# Patient Record
Sex: Male | Born: 1990 | Race: Black or African American | Hispanic: No | Marital: Single | Smoking: Never smoker
Health system: Southern US, Community
[De-identification: ages and names within clinical notes are randomized; demographics above are authoritative.]

## PROBLEM LIST (undated history)

## (undated) DIAGNOSIS — E119 Type 2 diabetes mellitus without complications: Secondary | ICD-10-CM

## (undated) DIAGNOSIS — E669 Obesity, unspecified: Secondary | ICD-10-CM

---

## 2011-02-01 ENCOUNTER — Emergency Department (HOSPITAL_COMMUNITY)
Admission: EM | Admit: 2011-02-01 | Discharge: 2011-02-01 | Disposition: A | Discharge status: Home / Self Care | Payer: Managed Care, Other (non HMO) | Attending: Emergency Medicine | Admitting: Emergency Medicine

## 2011-02-01 DIAGNOSIS — S6000XA Contusion of unspecified finger without damage to nail, initial encounter: Secondary | ICD-10-CM | POA: Insufficient documentation

## 2011-02-01 DIAGNOSIS — M79609 Pain in unspecified limb: Secondary | ICD-10-CM | POA: Insufficient documentation

## 2011-02-01 DIAGNOSIS — S61209A Unspecified open wound of unspecified finger without damage to nail, initial encounter: Secondary | ICD-10-CM | POA: Insufficient documentation

## 2011-02-01 DIAGNOSIS — W268XXA Contact with other sharp object(s), not elsewhere classified, initial encounter: Secondary | ICD-10-CM | POA: Insufficient documentation

## 2013-02-21 ENCOUNTER — Emergency Department (HOSPITAL_COMMUNITY)
Admission: EM | Admit: 2013-02-21 | Discharge: 2013-02-22 | Disposition: A | Discharge status: Home / Self Care | Payer: Self-pay | Attending: Emergency Medicine | Admitting: Emergency Medicine

## 2013-02-21 ENCOUNTER — Encounter (HOSPITAL_COMMUNITY): Payer: Self-pay | Admitting: Emergency Medicine

## 2013-02-21 DIAGNOSIS — J3489 Other specified disorders of nose and nasal sinuses: Secondary | ICD-10-CM | POA: Insufficient documentation

## 2013-02-21 DIAGNOSIS — R059 Cough, unspecified: Secondary | ICD-10-CM | POA: Insufficient documentation

## 2013-02-21 DIAGNOSIS — J020 Streptococcal pharyngitis: Secondary | ICD-10-CM | POA: Insufficient documentation

## 2013-02-21 DIAGNOSIS — R05 Cough: Secondary | ICD-10-CM | POA: Insufficient documentation

## 2013-02-21 LAB — RAPID STREP SCREEN (MED CTR MEBANE ONLY): Streptococcus, Group A Screen (Direct): POSITIVE — AB

## 2013-02-21 NOTE — ED Notes (Signed)
PT. REPORTS SORE THROAT " HARD TO SWALLOW" FOR 2 DAYS WITH OCCASIONAL DRY COUGH , DENIES FEVER OR CHILLS.

## 2013-02-22 MED ORDER — PENICILLIN G BENZATHINE 1200000 UNIT/2ML IM SUSP
1.2000 10*6.[IU] | Freq: Once | INTRAMUSCULAR | Status: AC
  Administered 2013-02-22: 1.2 10*6.[IU] via INTRAMUSCULAR
  Filled 2013-02-22: qty 2

## 2013-02-22 NOTE — ED Notes (Signed)
Pt made aware that he has to wait 15-20 minutes after antibiotic to ensure he doesn't have an allergic reaction.

## 2013-02-22 NOTE — ED Provider Notes (Signed)
Medical screening examination/treatment/procedure(s) were performed by non-physician practitioner and as supervising physician I was immediately available for consultation/collaboration.  Karianna Gusman K Nolene Rocks-Rasch, MD 02/22/13 0134 

## 2013-02-22 NOTE — ED Provider Notes (Signed)
History     CSN: [id number]  Arrival date & time 02/21/13  2202   None     Chief Complaint  Patient presents with  . Sore Throat    (Consider location/radiation/quality/duration/timing/severity/associated sxs/prior treatment) HPI History provided by pt.   Pt presents w/ 2 days of sore throat.  Associated w/ rhinorrhea and cough.  Denies fever.  Has not taken anything for pain.  No known sick contacts.  History reviewed. No pertinent past medical history.  History reviewed. No pertinent past surgical history.  No family history on file.  History  Substance Use Topics  . Smoking status: Never Smoker   . Smokeless tobacco: Not on file  . Alcohol Use: Yes      Review of Systems  All other systems reviewed and are negative.    Allergies  Review of patient's allergies indicates no known allergies.  Home Medications  No current outpatient prescriptions on file.  BP 133/76  Pulse 65  Temp(Src) 98 F (36.7 C) (Oral)  Resp 14  SpO2 96%  Physical Exam  Nursing note and vitals reviewed. Constitutional: He is oriented to person, place, and time. He appears well-developed and well-nourished. No distress.  HENT:  Head: Normocephalic and atraumatic.  Erythema soft palate, tonsils and posterior pharynx.  Mild edema bilateral tonsils w/out exudate.  Uvula midline.  No trismus.   Eyes:  Normal appearance  Neck: Normal range of motion.  Cardiovascular: Normal rate and regular rhythm.   Pulmonary/Chest: Effort normal and breath sounds normal. No respiratory distress.  Musculoskeletal: Normal range of motion.  Lymphadenopathy:    He has cervical adenopathy.  Neurological: He is alert and oriented to person, place, and time.  Skin: Skin is warm and dry. No rash noted.  Psychiatric: He has a normal mood and affect. His behavior is normal.    ED Course  Procedures (including critical care time)  Labs Reviewed  RAPID STREP SCREEN - Abnormal; Notable for the following:     Streptococcus, Group A Screen (Direct) POSITIVE (*)    All other components within normal limits   No results found.   1. Strep pharyngitis       MDM  22yo healthy M presents w/ strep pharyngitis.  Received IM bicillin.  Recommended ibuprofen for pain.  Return precautions discussed.          Otilio Miu, PA-C 02/22/13 [phone]

## 2013-02-22 NOTE — ED Notes (Signed)
No s/s allergic reaction. Pt dx home.

## 2013-04-30 ENCOUNTER — Emergency Department (HOSPITAL_COMMUNITY)
Admission: EM | Admit: 2013-04-30 | Discharge: 2013-04-30 | Disposition: A | Discharge status: Home / Self Care | Payer: Self-pay | Attending: Emergency Medicine | Admitting: Emergency Medicine

## 2013-04-30 ENCOUNTER — Encounter (HOSPITAL_COMMUNITY): Payer: Self-pay | Admitting: Emergency Medicine

## 2013-04-30 DIAGNOSIS — R509 Fever, unspecified: Secondary | ICD-10-CM | POA: Insufficient documentation

## 2013-04-30 DIAGNOSIS — J020 Streptococcal pharyngitis: Secondary | ICD-10-CM | POA: Insufficient documentation

## 2013-04-30 DIAGNOSIS — H9209 Otalgia, unspecified ear: Secondary | ICD-10-CM | POA: Insufficient documentation

## 2013-04-30 MED ORDER — DEXAMETHASONE SODIUM PHOSPHATE 10 MG/ML IJ SOLN
10.0000 mg | Freq: Once | INTRAMUSCULAR | Status: AC
  Administered 2013-04-30: 10 mg via INTRAMUSCULAR
  Filled 2013-04-30: qty 1

## 2013-04-30 MED ORDER — PENICILLIN G BENZATHINE 1200000 UNIT/2ML IM SUSP
1.2000 10*6.[IU] | Freq: Once | INTRAMUSCULAR | Status: AC
  Administered 2013-04-30: 1.2 10*6.[IU] via INTRAMUSCULAR
  Filled 2013-04-30: qty 2

## 2013-04-30 NOTE — ED Provider Notes (Signed)
Medical screening examination/treatment/procedure(s) were performed by non-physician practitioner and as supervising physician I was immediately available for consultation/collaboration.   Gwyneth Sprout, MD 04/30/13 2154

## 2013-04-30 NOTE — ED Notes (Signed)
Started 4 days ago with sore throat, fever, chills, right ear pain. States his son has had strep.

## 2013-04-30 NOTE — ED Provider Notes (Signed)
  CSN: [id number]     Arrival date & time 04/30/13  0831 History     First MD Initiated Contact with Patient 04/30/13 0845     Chief Complaint  Patient presents with  . Sore Throat   (Consider location/radiation/quality/duration/timing/severity/associated sxs/prior Treatment) The history is provided by the patient and medical records.   Patient presents to the ED for sore throat x4 days associated symptoms include subjective fever, chills, and right ear pain. Patient states pain is worse with swallowing. Son recently diagnosed and treated for strep. No difficulty swallowing or breathing.  No meds taken PTA.  History reviewed. No pertinent past medical history. History reviewed. No pertinent past surgical history. No family history on file. History  Substance Use Topics  . Smoking status: Never Smoker   . Smokeless tobacco: Not on file  . Alcohol Use: Yes    Review of Systems  HENT: Positive for sore throat.   All other systems reviewed and are negative.    Allergies  Review of patient's allergies indicates no known allergies.  Home Medications  No current outpatient prescriptions on file. BP 136/77  Pulse 88  Temp(Src) 98.8 F (37.1 C) (Oral)  Resp 22  SpO2 98%  Physical Exam  Nursing note and vitals reviewed. Constitutional: He is oriented to person, place, and time. He appears well-developed and well-nourished.  HENT:  Head: Normocephalic and atraumatic. No trismus in the jaw.  Right Ear: Tympanic membrane and ear canal normal.  Left Ear: Tympanic membrane and ear canal normal.  Nose: Nose normal.  Mouth/Throat: Uvula is midline and mucous membranes are normal. No oral lesions. No edematous. Posterior oropharyngeal erythema present. No oropharyngeal exudate, posterior oropharyngeal edema or tonsillar abscesses.  Oropharynx erythematous, tonsils 2+ bilaterally without exudate, no oropharyngeal edema, airway patent, no PTA, handling secretions appropriately, speaking  in full complete sentences without difficulty  Eyes: Conjunctivae and EOM are normal. Pupils are equal, round, and reactive to light.  Neck: Normal range of motion. Neck supple.  Cardiovascular: Normal rate, regular rhythm and normal heart sounds.   Pulmonary/Chest: Effort normal and breath sounds normal.  Musculoskeletal: Normal range of motion.  Neurological: He is alert and oriented to person, place, and time.  Skin: Skin is warm and dry.  Psychiatric: He has a normal mood and affect.    ED Course   Procedures (including critical care time)  Labs Reviewed  RAPID STREP SCREEN - Abnormal; Notable for the following:    Streptococcus, Group A Screen (Direct) POSITIVE (*)    All other components within normal limits   No results found.  1. Strep pharyngitis     MDM   Rapid strep positive. Treated with Bicillin and Decadron in the ED. Followup with primary care physician if symptoms not improving in the next few days. Discussed plan with patient, he agreed. Return precautions advised.  Garlon Hatchet, PA-C 04/30/13 1555

## 2013-09-24 ENCOUNTER — Emergency Department (HOSPITAL_COMMUNITY)
Admission: EM | Admit: 2013-09-24 | Discharge: 2013-09-24 | Disposition: A | Discharge status: Home / Self Care | Payer: Managed Care, Other (non HMO) | Attending: Emergency Medicine | Admitting: Emergency Medicine

## 2013-09-24 ENCOUNTER — Encounter (HOSPITAL_COMMUNITY): Payer: Self-pay | Admitting: Emergency Medicine

## 2013-09-24 DIAGNOSIS — L0291 Cutaneous abscess, unspecified: Secondary | ICD-10-CM

## 2013-09-24 DIAGNOSIS — L0231 Cutaneous abscess of buttock: Secondary | ICD-10-CM | POA: Insufficient documentation

## 2013-09-24 DIAGNOSIS — E669 Obesity, unspecified: Secondary | ICD-10-CM | POA: Insufficient documentation

## 2013-09-24 HISTORY — DX: Obesity, unspecified: E66.9

## 2013-09-24 MED ORDER — SULFAMETHOXAZOLE-TRIMETHOPRIM 800-160 MG PO TABS: 2.0000 | ORAL_TABLET | Freq: Two times a day (BID) | ORAL | Status: DC

## 2013-09-24 NOTE — ED Notes (Signed)
Pt. reports  ? insect bite / abscess at right buttocks onset 2 days ago with no drainage.

## 2013-09-24 NOTE — ED Provider Notes (Signed)
CSN: [id number]     Arrival date & time 09/24/13  2009 History  This chart was scribed for non-physician practitioner, Santiago Glad, PA-C working with Glynn Octave, MD by Greggory Stallion, ED scribe. This patient was seen in room TR10C/TR10C and the patient's care was started at 9:17 PM.   Chief Complaint  Patient presents with  . Abscess  . Insect Bite   The history is provided by the patient. No language interpreter was used.   HPI Comments: Patrick Montes is a 22 y.o. male who presents to the Emergency Department complaining of a worsening abscess to his right buttock that he noticed 2 days ago. Denies any drainage. Denies fever, chills, nausea, emesis. Pt states he has never had an abscess before. Denies history of diabetes or HIV.  No treatment prior to arrival.  Past Medical History  Diagnosis Date  . Obesity    History reviewed. No pertinent past surgical history. No family history on file. History  Substance Use Topics  . Smoking status: Never Smoker   . Smokeless tobacco: Not on file  . Alcohol Use: Yes    Review of Systems  Constitutional: Negative for fever and chills.  Gastrointestinal: Negative for nausea and vomiting.  Skin: Positive for color change.       Positive for abscess.   All other systems reviewed and are negative.    Allergies  Review of patient's allergies indicates no known allergies.  Home Medications  No current outpatient prescriptions on file.  BP 161/83  Pulse 108  Temp(Src) 98.1 F (36.7 C) (Oral)  Resp 18  Wt 350 lb (158.759 kg)  SpO2 99%  Physical Exam  Nursing note and vitals reviewed. Constitutional: He appears well-developed and well-nourished.  HENT:  Head: Normocephalic and atraumatic.  Mouth/Throat: Oropharynx is clear and moist.  Eyes: EOM are normal. Pupils are equal, round, and reactive to light.  Neck: Normal range of motion. Neck supple.  Cardiovascular: Normal rate, regular rhythm and normal heart sounds.    Pulmonary/Chest: Effort normal and breath sounds normal. He has no wheezes.  Musculoskeletal: Normal range of motion.  Neurological: He is alert.  Skin: Skin is warm and dry.  3-4 cm in diameter indurated area of the right buttock with mild surrounding erythema and warmth.   Psychiatric: He has a normal mood and affect. His behavior is normal.    ED Course  Procedures (including critical care time)  DIAGNOSTIC STUDIES: Oxygen Saturation is 99% on RA, normal by my interpretation.    COORDINATION OF CARE: 9:19 PM-Discussed treatment plan which includes I&D with pt at bedside and pt agreed to plan.   INCISION AND DRAINAGE Performed by: Santiago Glad, PA-C Consent: Verbal consent obtained. Risks and benefits: risks, benefits and alternatives were discussed Type: abscess  Body area: right buttock  Anesthesia: local infiltration  Incision was made with a scalpel.  Local anesthetic: lidocaine 2% with epinephrine  Anesthetic total: 4 ml  Complexity: complex Blunt dissection to break up loculations  Drainage: purulent  Drainage amount: small  Packing material: none  Patient tolerance: Patient tolerated the procedure well with no immediate complications.   Labs Review Labs Reviewed - No data to display Imaging Review No results found.  EKG Interpretation   None       MDM  No diagnosis found. Patient presenting with an abscess of his right buttock.  Area incised and drained in the ED with little drainage.  Patient started on Bactrim DS.  Patient is afebrile and  non toxic appearing.  Feel that the patient is stable for discharge.  Return precautions given.  I personally performed the services described in this documentation, which was scribed in my presence. The recorded information has been reviewed and is accurate.   Santiago Glad, PA-C 09/24/13 2229

## 2013-09-25 NOTE — ED Provider Notes (Signed)
Medical screening examination/treatment/procedure(s) were performed by non-physician practitioner and as supervising physician I was immediately available for consultation/collaboration.  EKG Interpretation   None         Skyah Hannon, MD 09/25/13 0035 

## 2014-03-16 ENCOUNTER — Emergency Department (HOSPITAL_COMMUNITY): Payer: Managed Care, Other (non HMO)

## 2014-03-16 ENCOUNTER — Emergency Department (HOSPITAL_COMMUNITY)
Admission: EM | Admit: 2014-03-16 | Discharge: 2014-03-16 | Disposition: A | Discharge status: Home / Self Care | Payer: Managed Care, Other (non HMO) | Attending: Emergency Medicine | Admitting: Emergency Medicine

## 2014-03-16 ENCOUNTER — Encounter (HOSPITAL_COMMUNITY): Payer: Self-pay | Admitting: Emergency Medicine

## 2014-03-16 DIAGNOSIS — S0990XA Unspecified injury of head, initial encounter: Secondary | ICD-10-CM | POA: Insufficient documentation

## 2014-03-16 DIAGNOSIS — S5010XA Contusion of unspecified forearm, initial encounter: Secondary | ICD-10-CM

## 2014-03-16 DIAGNOSIS — E669 Obesity, unspecified: Secondary | ICD-10-CM | POA: Insufficient documentation

## 2014-03-16 DIAGNOSIS — S0101XA Laceration without foreign body of scalp, initial encounter: Secondary | ICD-10-CM

## 2014-03-16 DIAGNOSIS — S0100XA Unspecified open wound of scalp, initial encounter: Secondary | ICD-10-CM | POA: Insufficient documentation
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
## 2014-03-16 NOTE — Discharge Instructions (Signed)
Contusion A contusion is a deep bruise. Contusions are the result of an injury that caused bleeding under the skin. The contusion may turn blue, purple, or yellow. Minor injuries will give you a painless contusion, but more severe contusions may stay painful and swollen for a few weeks.  CAUSES  A contusion is usually caused by a blow, trauma, or direct force to an area of the body. SYMPTOMS   Swelling and redness of the injured area.  Bruising of the injured area.  Tenderness and soreness of the injured area.  Pain. DIAGNOSIS  The diagnosis can be made by taking a history and physical exam. An X-ray, CT scan, or MRI may be needed to determine if there were any associated injuries, such as fractures. TREATMENT  Specific treatment will depend on what area of the body was injured. In general, the best treatment for a contusion is resting, icing, elevating, and applying cold compresses to the injured area. Over-the-counter medicines may also be recommended for pain control. Ask your caregiver what the best treatment is for your contusion. HOME CARE INSTRUCTIONS   Put ice on the injured area.  Put ice in a plastic bag.  Place a towel between your skin and the bag.  Leave the ice on for 15-20 minutes, 03-04 times a day.  Only take over-the-counter or prescription medicines for pain, discomfort, or fever as directed by your caregiver. Your caregiver may recommend avoiding anti-inflammatory medicines (aspirin, ibuprofen, and naproxen) for 48 hours because these medicines may increase bruising.  Rest the injured area.  If possible, elevate the injured area to reduce swelling. SEEK IMMEDIATE MEDICAL CARE IF:   You have increased bruising or swelling.  You have pain that is getting worse.  Your swelling or pain is not relieved with medicines. MAKE SURE YOU:   Understand these instructions.  Will watch your condition.  Will get help right away if you are not doing well or get  worse. Document Released: 06/30/2005 Document Revised: 12/13/2011 Document Reviewed: 07/26/2011 [facility]ExitCare Patient Information 2014 [place] HarborExitCare, [place].  Head Injury The head may be injured, even if there are no obvious signs of injury. Obvious signs of injury include, loss of consciousness (being "knocked out"), or physical signs, such as an open wound (the skin is broken) or bruising (ecchymosis). SYMPTOMS   Symptoms depend on the extent of injury.  The seriousness of a head injury is not related to the presence of physical signs, such as swelling.  Headache.  Nausea and vomiting.  Drowsiness.  Memory loss (amnesia).  Vision problems.  Confusion or irritability.  Pupils of different size.  Pupils that do not react to light.  Loss of consciousness, either temporary or for long periods.  Bleeding of the scalp, if the skin is broken. CAUSES  The most common cause of head injury is direct hit (trauma) to the head. Common causes of this injury include: motor vehicle crashes, falls, or tackling with the head (football). RISK INCREASES WITH:   Contact sports (i.e. football, boxing), riding bicycles, motorcycles, or horses without a helmet.  Seizure disorders.  Drinking alcohol.  Use of mind-altering drugs. PREVENTION   Wear properly fitted and padded protective headgear.  Do not drink alcohol or use mind-altering drugs and drive. PROGNOSIS  If treated early, most head injuries can be cured. However, certain problems involved with head injuries can be life threatening. RELATED COMPLICATIONS   Subdural hemorrhage or epidural hematoma (bleeding under the skull).  Concussion (injury to the brain).  Bleeding  into the brain. TREATMENT  Any injury to the head should be evaluated by a medical professional, especially if the injury involves loss of consciousness or other symptoms noted above. Severe head injuries may require hospitalization and possible surgery, to relieve  pressure on the brain. After evaluation, if you are sent to be watched at home, it is important to have someone else wake you up every 2 hours, for at least 24 hours following the injury. If the person is not able to wake you, or if there appears to be a change in your responsiveness, he or she must contact your caregiver immediately. This may be a sign of a more serious injury. Also, report any of the following symptoms: nausea, vomiting, inability to move arms and legs equally well on both sides, fever (above 100 F or 37.8 C), neck stiffness, pupils of unequal size or shape or reaction to light, convulsions, noticeable restlessness, severe headache that persists for longer than 4 hours after injury, confusion, disorientation, or mental status changes.  MEDICATION  Do not take any medicines, unless advised by your caregiver. This includes over-the-counter pain medicines (i.e. ibuprofen, acetaminophen, aspirin). SEEK MEDICAL CARE IF:   Symptoms get worse or do not improve in 24 hours.  Any of the following symptoms occur:  Vomiting.  Inability to move arms and legs equally well on both sides.  Fever above 100 F (37.8 C).  Stiff neck.  Pupils of unequal size, shape, or reaction to light.  Convulsions (violent shaking).  Noticeable restlessness.  Severe headache that persists for longer than 4 hours after injury.  Confusion, disorientation or mental status changes. Document Released: 09/20/2005 Document Revised: 12/13/2011 Document Reviewed: 01/02/2009 [facility]ExitCare Patient Information 2014 [place], [place]LL[place].

## 2014-03-16 NOTE — ED Notes (Signed)
PATIENT TRANSPORTED TO CT AND XRAY

## 2014-12-13 ENCOUNTER — Emergency Department (HOSPITAL_COMMUNITY)
Admission: EM | Admit: 2014-12-13 | Discharge: 2014-12-13 | Disposition: A | Discharge status: Home / Self Care | Payer: Managed Care, Other (non HMO) | Attending: Emergency Medicine | Admitting: Emergency Medicine

## 2014-12-13 ENCOUNTER — Encounter (HOSPITAL_COMMUNITY): Payer: Self-pay | Admitting: Cardiology

## 2014-12-13 DIAGNOSIS — E669 Obesity, unspecified: Secondary | ICD-10-CM | POA: Diagnosis not present

## 2014-12-13 DIAGNOSIS — M25562 Pain in left knee: Secondary | ICD-10-CM | POA: Insufficient documentation

## 2014-12-13 DIAGNOSIS — Z79899 Other long term (current) drug therapy: Secondary | ICD-10-CM | POA: Insufficient documentation

## 2014-12-13 DIAGNOSIS — M25561 Pain in right knee: Secondary | ICD-10-CM | POA: Insufficient documentation

## 2014-12-13 DIAGNOSIS — M79605 Pain in left leg: Secondary | ICD-10-CM | POA: Diagnosis present

## 2014-12-13 DIAGNOSIS — R51 Headache: Secondary | ICD-10-CM | POA: Insufficient documentation

## 2014-12-13 MED ORDER — IBUPROFEN 600 MG PO TABS: 600.0000 mg | ORAL_TABLET | Freq: Four times a day (QID) | ORAL | Status: DC | PRN

## 2014-12-13 MED ORDER — IBUPROFEN 800 MG PO TABS
800.0000 mg | ORAL_TABLET | Freq: Once | ORAL | Status: AC
Start: 2014-12-13 — End: 2014-12-13
  Administered 2014-12-13: 800 mg via ORAL
  Filled 2014-12-13: qty 1

## 2014-12-13 NOTE — ED Notes (Signed)
Pt comfortable with discharge and follow up instructions. Pt declines wheelchair, escorted to waiting area by this RN. Prescriptions x1. 

## 2014-12-13 NOTE — Discharge Instructions (Signed)

## 2014-12-13 NOTE — ED Notes (Signed)
Pt reports bilateral knee pain and a headache for the past couple of day. Some abd pain but no n/v.

## 2014-12-13 NOTE — ED Provider Notes (Signed)
CSN: [id number]     Arrival date & time 12/13/14  1001 History   First MD Initiated Contact with Patient 12/13/14 1008     Chief Complaint  Patient presents with  . Leg Pain  . Headache     (Consider location/radiation/quality/duration/timing/severity/associated sxs/prior Treatment) HPI Patrick Montes is a 24 y.o. black male who has chronic knee pain and presents today for bilateral knee pain that is worse with squatting and using the stairs for the past couple of weeks. The pain does not radiate anywhere. He has recently lost 20 pounds by working out at [employer] He denies any injury.  He denies any fever, grinding, popping sensation. He has no pain now while sitting on the bed but it is 10/10 when he is working out at [employer]. He has not taken anything for knee pain in the past.  Past Medical History  Diagnosis Date  . Obesity    History reviewed. No pertinent past surgical history. History reviewed. No pertinent family history. History  Substance Use Topics  . Smoking status: Never Smoker   . Smokeless tobacco: Not on file  . Alcohol Use: Yes    Review of Systems  Constitutional: Negative for fever and chills.  HENT: Negative for ear pain.   Eyes: Negative for visual disturbance.  Gastrointestinal: Negative for nausea, vomiting and abdominal pain.  Musculoskeletal: Negative for back pain, neck pain and neck stiffness.  Neurological: Positive for headaches. Negative for dizziness, seizures, syncope and weakness.  All other systems reviewed and are negative.     Allergies  Review of patient's allergies indicates no known allergies.  Home Medications   Prior to Admission medications   Medication Sig Start Date End Date Taking? Authorizing Provider  acetaminophen (TYLENOL) 500 MG tablet Take 500 mg by mouth every 8 (eight) hours as needed for headache.   Yes Historical Provider, MD  ibuprofen (ADVIL,MOTRIN) 600 MG tablet Take 1 tablet (600 mg total) by mouth every 6 (six)  hours as needed. 12/13/14   Jemery Stacey Patel-Mills, PA-C   BP 165/90 mmHg  Pulse 102  Temp(Src) 98.6 F (37 C) (Oral)  Resp 20  Ht  (1.88 m)  Wt 335 lb (151.955 kg)  BMI 42.99 kg/m2  SpO2 96% Physical Exam  Constitutional: He is oriented to person, place, and time. He appears well-developed and well-nourished.  HENT:  Head: Normocephalic and atraumatic.  Mouth/Throat: Oropharynx is clear and moist.  Eyes: Conjunctivae are normal.  Neck: Normal range of motion. Neck supple.  Cardiovascular: Normal rate, regular rhythm and normal heart sounds.   Pulmonary/Chest: Effort normal and breath sounds normal.  Abdominal: Soft. There is no tenderness.  Musculoskeletal: Normal range of motion.  Negative anterior and posterior drawer. Negative valgus and varus. No patellar tenderness. He is able to ambulate in the ED.  No fibular head tenderness. He is able to flex knee to 90 degrees.   Neurological: He is alert and oriented to person, place, and time.  Skin: Skin is warm and dry.  Nursing note and vitals reviewed.   ED Course  Procedures (including critical care time) Labs Review Labs Reviewed - No data to display  Imaging Review No results found.   EKG Interpretation None      MDM   Final diagnoses:  Knee pain, bilateral  He says is headache is secondary to a hangover from drinking during hs birthday. No neurological signs.  No abdominal pain.  Patient is sitting comfortably and talking on the phone.  He requested bilateral knee xrays due to pain.  His knee exam was normal. [place] knee rule shows low risk. No injury to bilateral knees.  I explained to the patient that I did not think anything was acutely wrong with his knees and I have a low suspicion for a fracture.  No need to xray.  F/u with PCP and take ibuprofen as needed for pain. RICE. Patient agrees with plan.       Catha GosselinHanna Patel-Mills, PA-C 12/13/14 1049  Gilda Creasehristopher J Pollina, MD 12/13/14 1050

## 2015-01-05 ENCOUNTER — Emergency Department (HOSPITAL_COMMUNITY)
Admission: EM | Admit: 2015-01-05 | Discharge: 2015-01-05 | Disposition: A | Discharge status: Home / Self Care | Payer: Managed Care, Other (non HMO) | Attending: Emergency Medicine | Admitting: Emergency Medicine

## 2015-01-05 ENCOUNTER — Encounter (HOSPITAL_COMMUNITY): Payer: Self-pay | Admitting: Emergency Medicine

## 2015-01-05 DIAGNOSIS — M25562 Pain in left knee: Secondary | ICD-10-CM | POA: Insufficient documentation

## 2015-01-05 DIAGNOSIS — E669 Obesity, unspecified: Secondary | ICD-10-CM | POA: Insufficient documentation

## 2015-01-05 DIAGNOSIS — M25561 Pain in right knee: Secondary | ICD-10-CM | POA: Insufficient documentation

## 2015-01-05 MED ORDER — IBUPROFEN 600 MG PO TABS: 600.0000 mg | ORAL_TABLET | Freq: Four times a day (QID) | ORAL | Status: DC | PRN

## 2015-01-05 MED ORDER — TRAMADOL HCL 50 MG PO TABS: 50.0000 mg | ORAL_TABLET | Freq: Four times a day (QID) | ORAL | Status: DC | PRN

## 2015-01-05 NOTE — ED Notes (Signed)
Pt made aware to return if symptoms worsen or if any life threatening symptoms occur.   

## 2015-01-05 NOTE — ED Notes (Signed)
Pt c/o B/L knee pain x 4 days since playing basketball. Pt ambulatory in triage.

## 2015-01-05 NOTE — ED Provider Notes (Signed)
CSN: [id number]     Arrival date & time 01/05/15  1734 History  This chart was scribed for non-physician practitioner, Junius Finner, PA-C working with Jerelyn Scott, MD by Greggory Stallion, ED scribe. This patient was seen in room TR10C/TR10C and the patient's care was started at 6:29 PM.    Chief Complaint  Patient presents with  . Knee Pain   The history is provided by the patient. No language interpreter was used.    HPI Comments: Patrick Montes is a 24 y.o. male who presents to the Emergency Department complaining of bilateral knee pain that started 4 days ago after playing basketball. Pt denies fall or specific injury. Bearing weight worsens pain and causes it to be 10/10. Pain is minimal to non-existent while pt at rest and sitting. He has not yet taken any medications. There are no alleviating factors.   Past Medical History  Diagnosis Date  . Obesity    History reviewed. No pertinent past surgical history. No family history on file. History  Substance Use Topics  . Smoking status: Never Smoker   . Smokeless tobacco: Not on file  . Alcohol Use: Yes    Review of Systems  Musculoskeletal: Positive for arthralgias.  All other systems reviewed and are negative.  Allergies  Review of patient's allergies indicates no known allergies.  Home Medications   Prior to Admission medications   Medication Sig Start Date End Date Taking? Authorizing Provider  acetaminophen (TYLENOL) 500 MG tablet Take 500 mg by mouth every 8 (eight) hours as needed for headache.    Historical Provider, MD  ibuprofen (ADVIL,MOTRIN) 600 MG tablet Take 1 tablet (600 mg total) by mouth every 6 (six) hours as needed. 12/13/14   Hanna Patel-Mills, PA-C  ibuprofen (ADVIL,MOTRIN) 600 MG tablet Take 1 tablet (600 mg total) by mouth every 6 (six) hours as needed. 01/05/15   Junius Finner, PA-C  traMADol (ULTRAM) 50 MG tablet Take 1 tablet (50 mg total) by mouth every 6 (six) hours as needed. 01/05/15   Junius Finner, PA-C    BP 146/76 mmHg  Pulse 70  Temp(Src) 98.1 F (36.7 C) (Oral)  Resp 18  Ht  (1.88 m)  Wt 342 lb 8 oz (155.357 kg)  BMI 43.96 kg/m2  SpO2 96%   Physical Exam  Constitutional: He is oriented to person, place, and time. He appears well-developed and well-nourished.  HENT:  Head: Normocephalic and atraumatic.  Eyes: EOM are normal.  Neck: Normal range of motion.  Cardiovascular: Normal rate.   Pulmonary/Chest: Effort normal.  Musculoskeletal: Normal range of motion.  No deformity in bilateral knees. Full ROM. Tenderness over tibial tuberosity bilaterally. No edema, erythema, or warmth. No crepitus.   Neurological: He is alert and oriented to person, place, and time.  Skin: Skin is warm and dry.  Psychiatric: He has a normal mood and affect. His behavior is normal.  Nursing note and vitals reviewed.   ED Course  Procedures (including critical care time)  DIAGNOSTIC STUDIES: Oxygen Saturation is 96% on RA, normal by my interpretation.    COORDINATION OF CARE: 6:30 PM-Discussed treatment plan which includes an anti-inflammatory and rest with pt at bedside and pt agreed to plan. Will give pt an orthopedic referral and advised him to follow up.  Labs Review Labs Reviewed - No data to display  Imaging Review No results found.   EKG Interpretation None      MDM   Final diagnoses:  Bilateral knee pain  Pt is a 24yo male c/o bilateral knee pain after playing basketball. No hx of injury. No edema or erythema. Tenderness over tibial tuberosity. Likely an overuse injury. No imaging indicated at this time. Will tx with mobic and discussed use of R.I.C.E tx at home. F/u with PCP as needed as well as Dr. Roda ShuttersXu, orthopedics if not improving in 1-2 weeks. Pt verbalized understanding and agreement with tx plan.  I personally performed the services described in this documentation, which was scribed in my presence. The recorded information has been reviewed and is  accurate.   Junius Finnerrin O'Malley, PA-C 01/05/15 1853  Jerelyn ScottMartha Linker, MD 01/05/15 [phone]

## 2015-09-01 ENCOUNTER — Encounter: Payer: Self-pay | Admitting: Internal Medicine

## 2016-12-17 ENCOUNTER — Emergency Department (HOSPITAL_COMMUNITY)
Admission: EM | Admit: 2016-12-17 | Discharge: 2016-12-17 | Disposition: A | Discharge status: Home / Self Care | Payer: Commercial Managed Care - PPO | Attending: Emergency Medicine | Admitting: Emergency Medicine

## 2016-12-17 ENCOUNTER — Encounter (HOSPITAL_COMMUNITY): Payer: Self-pay

## 2016-12-17 DIAGNOSIS — J020 Streptococcal pharyngitis: Secondary | ICD-10-CM

## 2016-12-17 DIAGNOSIS — J029 Acute pharyngitis, unspecified: Secondary | ICD-10-CM | POA: Diagnosis present

## 2016-12-17 LAB — RAPID STREP SCREEN (MED CTR MEBANE ONLY): Streptococcus, Group A Screen (Direct): POSITIVE — AB

## 2016-12-17 MED ORDER — IBUPROFEN 600 MG PO TABS: 600.0000 mg | ORAL_TABLET | Freq: Four times a day (QID) | ORAL | 0 refills | Status: DC | PRN

## 2016-12-17 MED ORDER — NAPROXEN 250 MG PO TABS
500.0000 mg | ORAL_TABLET | Freq: Two times a day (BID) | ORAL | Status: DC
  Administered 2016-12-17: 500 mg via ORAL
  Filled 2016-12-17: qty 2

## 2016-12-17 MED ORDER — HYDROCODONE-ACETAMINOPHEN 5-325 MG PO TABS: 1.0000 | ORAL_TABLET | Freq: Four times a day (QID) | ORAL | 0 refills | Status: DC | PRN

## 2016-12-17 MED ORDER — PREDNISONE 10 MG PO TABS: 40.0000 mg | ORAL_TABLET | Freq: Every day | ORAL | 0 refills | Status: DC

## 2016-12-17 MED ORDER — PENICILLIN G BENZATHINE 1200000 UNIT/2ML IM SUSP
1.2000 10*6.[IU] | Freq: Once | INTRAMUSCULAR | Status: AC
  Administered 2016-12-17: 1.2 10*6.[IU] via INTRAMUSCULAR
  Filled 2016-12-17: qty 2

## 2016-12-17 MED ORDER — DEXAMETHASONE SODIUM PHOSPHATE 10 MG/ML IJ SOLN
10.0000 mg | Freq: Once | INTRAMUSCULAR | Status: AC
Start: 2016-12-17 — End: 2016-12-17
  Administered 2016-12-17: 10 mg via INTRAMUSCULAR
  Filled 2016-12-17: qty 1

## 2016-12-17 NOTE — ED Triage Notes (Signed)
Pt reports sore throat, ear pain, body aches, and tactile fever x 3 days.

## 2016-12-17 NOTE — Discharge Instructions (Signed)
You have a strep infection. We gave you the IM penicillin for it. Hydrate well.

## 2016-12-17 NOTE — ED Notes (Signed)
ED Provider at bedside. 

## 2016-12-17 NOTE — ED Provider Notes (Signed)
[facility]-EMERGENCY DEPT Provider Note   CSN: [id number] Arrival date & time: 12/17/16  0532     History   Chief Complaint Chief Complaint  Patient presents with  . Sore Throat  . Otalgia    HPI Patrick Montes is a 26 y.o. male.  HPI  SUBJECTIVE:  Patrick Montes is a 26 y.o. male who complains of sore throat for 2 days. He denies a history of chest pain, dizziness, shortness of breath, vomiting and cough and denies a history of asthma. Patient has smoke cigarettes.   OBJECTIVE: He appears well, vital signs are as noted. Ears normal.  Throat and pharynx normal.  Neck supple. No adenopathy in the neck. Nose is congested. Sinuses non tender. The chest is clear, without wheezes or rales.     Past Medical History:  Diagnosis Date  . Obesity     There are no active problems to display for this patient.   History reviewed. No pertinent surgical history.     Home Medications    Prior to Admission medications   Medication Sig Start Date End Date Taking? Authorizing Provider  acetaminophen (TYLENOL) 500 MG tablet Take 500 mg by mouth every 8 (eight) hours as needed for headache.    Historical Provider, MD  ibuprofen (ADVIL,MOTRIN) 600 MG tablet Take 1 tablet (600 mg total) by mouth every 6 (six) hours as needed. 12/13/14   Hanna Patel-Mills, PA-C  ibuprofen (ADVIL,MOTRIN) 600 MG tablet Take 1 tablet (600 mg total) by mouth every 6 (six) hours as needed. 12/17/16   Derwood Kaplan, MD  traMADol (ULTRAM) 50 MG tablet Take 1 tablet (50 mg total) by mouth every 6 (six) hours as needed. 01/05/15   Junius Finner, PA-C    Family History No family history on file.  Social History Social History  Substance Use Topics  . Smoking status: Never Smoker  . Smokeless tobacco: Never Used  . Alcohol use Yes     Allergies   Patient has no known allergies.   Review of Systems Review of Systems  Constitutional: Negative for activity change.  HENT: Positive for sore throat. Negative for  trouble swallowing and voice change.   Respiratory: Negative for cough.   Cardiovascular: Negative for chest pain.  Gastrointestinal: Negative for vomiting.      Physical Exam Updated Vital Signs BP (!) 157/75 (BP Location: Right Arm)   Pulse 98   Temp 98.3 F (36.8 C) (Oral)   Resp 18   Ht 6\' 3"  (1.905 m)   Wt (!) 376 lb (170.6 kg)   SpO2 100%   BMI 47.00 kg/m   Physical Exam  Constitutional: He is oriented to person, place, and time. He appears well-developed.  HENT:  Head: Atraumatic.  Mouth/Throat: Oropharyngeal exudate present.  Neck: Neck supple.  Cardiovascular: Normal rate.   Pulmonary/Chest: Effort normal.  Abdominal: Soft. There is no guarding.  Lymphadenopathy:    He has cervical adenopathy.  Neurological: He is alert and oriented to person, place, and time.  Skin: Skin is warm.  Nursing note and vitals reviewed.    ED Treatments / Results  Labs (all labs ordered are listed, but only abnormal results are displayed) Labs Reviewed  RAPID STREP SCREEN (NOT AT [facility]) - Abnormal; Notable for the following:       Result Value   Streptococcus, Group A Screen (Direct) POSITIVE (*)    All other components within normal limits    EKG  EKG Interpretation None       Radiology  No results found.  Procedures Procedures (including critical care time)  Medications Ordered in ED Medications  naproxen (NAPROSYN) tablet 500 mg (500 mg Oral Given 12/17/16 0731)  penicillin g benzathine (BICILLIN LA) 1200000 UNIT/2ML injection 1.2 Million Units (not administered)  dexamethasone (DECADRON) injection 10 mg (10 mg Intramuscular Given 12/17/16 0732)     Initial Impression / Assessment and Plan / ED Course  I have reviewed the triage vital signs and the nursing notes.  Pertinent labs & imaging results that were available during my care of the patient were reviewed by me and considered in my medical decision making (see chart for details).     ASSESSMENT:    strep pharyngitis  PLAN: Symptomatic therapy suggested: push fluids, rest and return office visit prn if symptoms persist or worsen. Lack of antibiotic effectiveness discussed with him. Call or return to clinic prn if these symptoms worsen or fail to improve as anticipated.  Final Clinical Impressions(s) / ED Diagnoses   Final diagnoses:  Strep pharyngitis    New Prescriptions New Prescriptions   IBUPROFEN (ADVIL,MOTRIN) 600 MG TABLET    Take 1 tablet (600 mg total) by mouth every 6 (six) hours as needed.     Derwood KaplanAnkit Donnielle Addison, MD 12/17/16 [id number]

## 2016-12-19 ENCOUNTER — Emergency Department (HOSPITAL_COMMUNITY)
Admission: EM | Admit: 2016-12-19 | Discharge: 2016-12-19 | Disposition: A | Discharge status: Home / Self Care | Payer: Commercial Managed Care - PPO | Attending: Emergency Medicine | Admitting: Emergency Medicine

## 2016-12-19 DIAGNOSIS — Z79899 Other long term (current) drug therapy: Secondary | ICD-10-CM | POA: Diagnosis not present

## 2016-12-19 DIAGNOSIS — J029 Acute pharyngitis, unspecified: Secondary | ICD-10-CM | POA: Insufficient documentation

## 2016-12-19 MED ORDER — IBUPROFEN 400 MG PO TABS
ORAL_TABLET | ORAL | Status: AC
  Filled 2016-12-19: qty 2

## 2016-12-19 MED ORDER — IBUPROFEN 400 MG PO TABS
800.0000 mg | ORAL_TABLET | Freq: Once | ORAL | Status: AC
  Administered 2016-12-19: 800 mg via ORAL

## 2016-12-19 MED ORDER — LIDOCAINE VISCOUS 2 % MT SOLN
15.0000 mL | Freq: Once | OROMUCOSAL | Status: AC
  Administered 2016-12-19: 15 mL via OROMUCOSAL
  Filled 2016-12-19: qty 15

## 2016-12-19 MED ORDER — AMOXICILLIN-POT CLAVULANATE 875-125 MG PO TABS: 1.0000 | ORAL_TABLET | Freq: Two times a day (BID) | ORAL | 0 refills | Status: AC

## 2016-12-19 MED ORDER — DEXAMETHASONE 4 MG PO TABS
10.0000 mg | ORAL_TABLET | Freq: Once | ORAL | Status: AC
  Administered 2016-12-19: 10 mg via ORAL
  Filled 2016-12-19: qty 3

## 2016-12-19 NOTE — ED Triage Notes (Signed)
Pt states he was diagnosed with Strep throat on Friday and given an antibiotic injection that day. States he started to feel better that night but now his symptoms have returned and feels worse than he did before. States he feels like the right side of his throat is more swollen and pt has pain upon palpation to the right side of his neck. States he took theraflu this morning pta.

## 2016-12-19 NOTE — ED Notes (Signed)
Declined W/C at D/C and was escorted to lobby by RN. 

## 2016-12-19 NOTE — ED Provider Notes (Signed)
[facility] Provider Note   CSN: [id number] Arrival date & time: 12/19/16  1478     History   Chief Complaint Chief Complaint  Patient presents with  . Sore Throat    HPI Patrick Montes is a 26 y.o. male.  HPI   Sore throat 1 wk Penicillin IM and decadron given on Friday Initially felt improved but then felt worse Continuing pain, throbbing, worse on the left side Painful swallowing No drooling, no significant voice changes, no shortness of breath No fevers, n/v Had mild cough and congestion  Past Medical History:  Diagnosis Date  . Obesity     There are no active problems to display for this patient.   No past surgical history on file.     Home Medications    Prior to Admission medications   Medication Sig Start Date End Date Taking? Authorizing Provider  acetaminophen (TYLENOL) 500 MG tablet Take 500 mg by mouth every 8 (eight) hours as needed for headache.    Historical Provider, MD  amoxicillin-clavulanate (AUGMENTIN) 875-125 MG tablet Take 1 tablet by mouth every 12 (twelve) hours. 12/19/16 12/26/16  Alvira Monday, MD  ibuprofen (ADVIL,MOTRIN) 600 MG tablet Take 1 tablet (600 mg total) by mouth every 6 (six) hours as needed. 12/13/14   Hanna Patel-Mills, PA-C  ibuprofen (ADVIL,MOTRIN) 600 MG tablet Take 1 tablet (600 mg total) by mouth every 6 (six) hours as needed. 12/17/16   Derwood Kaplan, MD  traMADol (ULTRAM) 50 MG tablet Take 1 tablet (50 mg total) by mouth every 6 (six) hours as needed. 01/05/15   Junius Finner, PA-C    Family History No family history on file.  Social History Social History  Substance Use Topics  . Smoking status: Never Smoker  . Smokeless tobacco: Never Used  . Alcohol use Yes     Allergies   Patient has no known allergies.   Review of Systems Review of Systems  Constitutional: Negative for fever.  HENT: Positive for congestion, sore throat and trouble swallowing (pain). Negative for voice change.   Eyes:  Negative for visual disturbance.  Respiratory: Positive for cough. Negative for shortness of breath.   Cardiovascular: Negative for chest pain.  Gastrointestinal: Negative for abdominal pain, nausea and vomiting.  Genitourinary: Negative for difficulty urinating.  Musculoskeletal: Negative for back pain and neck stiffness.  Skin: Negative for rash.  Neurological: Negative for syncope and headaches.     Physical Exam Updated Vital Signs BP 134/75 (BP Location: Right Arm)   Pulse 69   Temp 97.8 F (36.6 C) (Oral)   Resp 14   SpO2 99%   Physical Exam  Constitutional: He is oriented to person, place, and time. He appears well-developed and well-nourished. No distress.  HENT:  Head: Normocephalic and atraumatic.  No significant exudate at this time No peritonsillar swelling or posterior pharyngeal swelling Base of uvula normal without swelling, string like shape to end of uvula, not known if baseline.   Eyes: Conjunctivae and EOM are normal.  Neck: Normal range of motion.  Full ROM, no stridor, no tracheal devation, mild cervical adenopathy  Cardiovascular: Normal rate, regular rhythm, normal heart sounds and intact distal pulses.  Exam reveals no gallop and no friction rub.   No murmur heard. Pulmonary/Chest: Effort normal and breath sounds normal. No respiratory distress. He has no wheezes. He has no rales.  Abdominal: Soft. He exhibits no distension. There is no tenderness. There is no guarding.  Musculoskeletal: He exhibits no edema.  Neurological: He  is alert and oriented to person, place, and time.  Skin: Skin is warm and dry. He is not diaphoretic.  Nursing note and vitals reviewed.    ED Treatments / Results  Labs (all labs ordered are listed, but only abnormal results are displayed) Labs Reviewed - No data to display  EKG  EKG Interpretation None       Radiology No results found.  Procedures Procedures (including critical care time)  Medications Ordered  in ED Medications  ibuprofen (ADVIL,MOTRIN) tablet 800 mg (800 mg Oral Given 12/19/16 1031)  dexamethasone (DECADRON) tablet 10 mg (10 mg Oral Given 12/19/16 1220)  lidocaine (XYLOCAINE) 2 % viscous mouth solution 15 mL (15 mLs Mouth/Throat Given 12/19/16 1221)     Initial Impression / Assessment and Plan / ED Course  I have reviewed the triage vital signs and the nursing notes.  Pertinent labs & imaging results that were available during my care of the patient were reviewed by me and considered in my medical decision making (see chart for details).    26yo male presents with continuing sore throat after diagnosis of strep throat on Friday and treatment with penicillin and decadron.  Patient without fever, with full ROM of neck, no stridor, no drooling, doubt epiglottitis, RPA. No sign of PTA on exam.  Will treat patient with augmentin with possible abx failure of penicillin alone, and recommend strict return precautions given possible very very early development of abscess with no sign of drainable fluid collection at this time. Pt states understanding of reasons to return. Patient discharged in stable condition with understanding of reasons to return.   Final Clinical Impressions(s) / ED Diagnoses   Final diagnoses:  Pharyngitis, unspecified etiology    New Prescriptions Discharge Medication List as of 12/19/2016 11:59 AM    START taking these medications   Details  amoxicillin-clavulanate (AUGMENTIN) 875-125 MG tablet Take 1 tablet by mouth every 12 (twelve) hours., Starting Sun 12/19/2016, Until Sun 12/26/2016, Print         Alvira MondayErin Evaline Waltman, MD 12/19/16 2012

## 2017-02-10 ENCOUNTER — Encounter (HOSPITAL_COMMUNITY): Payer: Self-pay | Admitting: *Deleted

## 2017-02-10 ENCOUNTER — Emergency Department (HOSPITAL_COMMUNITY)
Admission: EM | Admit: 2017-02-10 | Discharge: 2017-02-10 | Disposition: A | Discharge status: Home / Self Care | Payer: Commercial Managed Care - PPO | Attending: Emergency Medicine | Admitting: Emergency Medicine

## 2017-02-10 ENCOUNTER — Emergency Department (HOSPITAL_COMMUNITY): Payer: Commercial Managed Care - PPO

## 2017-02-10 DIAGNOSIS — Y929 Unspecified place or not applicable: Secondary | ICD-10-CM | POA: Insufficient documentation

## 2017-02-10 DIAGNOSIS — S9031XA Contusion of right foot, initial encounter: Secondary | ICD-10-CM | POA: Diagnosis not present

## 2017-02-10 DIAGNOSIS — X501XXA Overexertion from prolonged static or awkward postures, initial encounter: Secondary | ICD-10-CM | POA: Insufficient documentation

## 2017-02-10 DIAGNOSIS — Y999 Unspecified external cause status: Secondary | ICD-10-CM | POA: Insufficient documentation

## 2017-02-10 DIAGNOSIS — Y939 Activity, unspecified: Secondary | ICD-10-CM | POA: Insufficient documentation

## 2017-02-10 DIAGNOSIS — S99921A Unspecified injury of right foot, initial encounter: Secondary | ICD-10-CM | POA: Diagnosis present

## 2017-02-10 MED ORDER — IBUPROFEN 600 MG PO TABS: 600.0000 mg | ORAL_TABLET | Freq: Four times a day (QID) | ORAL | 0 refills | Status: DC | PRN

## 2017-02-10 NOTE — ED Triage Notes (Signed)
The pt is c/o his rt foot painful for two months since he twisted it.  It only hurts when he moves the foot around.  He just wants to see if its broken

## 2017-02-10 NOTE — Discharge Instructions (Signed)
See the podiatrist if not getting better in 2 weeks.

## 2017-02-10 NOTE — ED Provider Notes (Signed)
[facility] Provider Note   CSN: [id number] Arrival date & time: 02/10/17  0111     History   Chief Complaint Chief Complaint  Patient presents with  . Foot Injury    HPI Patrick FitchCalvin Montes is a 26 y.o. male.  HPI Pt comes in with cc of foot pain. Pt reports that he twisted his foot a month ago, and still has some pain and feels like there is an extra mass on the lateral mid foot. Pt has been having pain with ambulation and when he twists his legs. He has no numbness or tingling. Pain is primarily in the lateral aspect of the ankle and lateral mid foot.  Past Medical History:  Diagnosis Date  . Obesity     There are no active problems to display for this patient.   History reviewed. No pertinent surgical history.     Home Medications    Prior to Admission medications   Medication Sig Start Date End Date Taking? Authorizing Provider  acetaminophen (TYLENOL) 500 MG tablet Take 500 mg by mouth every 8 (eight) hours as needed for headache.    [provider]  ibuprofen (ADVIL,MOTRIN) 600 MG tablet Take 1 tablet (600 mg total) by mouth every 6 (six) hours as needed. 12/17/16   Derwood KaplanNanavati, Gardenia Witter, MD  ibuprofen (ADVIL,MOTRIN) 600 MG tablet Take 1 tablet (600 mg total) by mouth every 6 (six) hours as needed. 02/10/17   Derwood KaplanNanavati, Habeeb Puertas, MD  traMADol (ULTRAM) 50 MG tablet Take 1 tablet (50 mg total) by mouth every 6 (six) hours as needed. 01/05/15   Junius Finner'Malley, Erin, PA-C    Family History No family history on file.  Social History Social History  Substance Use Topics  . Smoking status: Never Smoker  . Smokeless tobacco: Never Used  . Alcohol use Yes     Allergies   Patient has no known allergies.   Review of Systems Review of Systems  Constitutional: Negative for activity change.  Musculoskeletal: Positive for arthralgias and gait problem.     Physical Exam Updated Vital Signs BP (!) 154/100 (BP Location: Left Arm)   Pulse (!) 113   Temp 97.8 F  (36.6 C) (Oral)   Resp 18   Ht 6' 2.5" (1.892 m)   Wt (!) 374 lb 5 oz (169.8 kg)   SpO2 93%   BMI 47.42 kg/m   Physical Exam  Constitutional: He is oriented to person, place, and time. He appears well-developed.  HENT:  Head: Atraumatic.  Neck: Neck supple.  Cardiovascular: Normal rate.   Pulmonary/Chest: Effort normal.  Musculoskeletal: He exhibits tenderness. He exhibits no edema or deformity.  Tenderness inferior to the lateral malleoli  Neurological: He is alert and oriented to person, place, and time.  Skin: Skin is warm.  Nursing note and vitals reviewed.    ED Treatments / Results  Labs (all labs ordered are listed, but only abnormal results are displayed) Labs Reviewed - No data to display  EKG  EKG Interpretation None       Radiology Dg Foot 2 Views Right  Result Date: 02/10/2017 CLINICAL DATA:  Patient missed a step 2 months ago. Right foot pain since then. EXAM: RIGHT FOOT - 2 VIEW COMPARISON:  None. FINDINGS: There is no evidence of fracture or dislocation. There is no evidence of arthropathy or other focal bone abnormality. Soft tissues are unremarkable. IMPRESSION: Negative. Electronically Signed   By: Burman NievesWilliam  Stevens M.D.   On: 02/10/2017 01:59    Procedures Procedures (including  critical care time)  Medications Ordered in ED Medications - No data to display   Initial Impression / Assessment and Plan / ED Course  I have reviewed the triage vital signs and the nursing notes.  Pertinent labs & imaging results that were available during my care of the patient were reviewed by me and considered in my medical decision making (see chart for details).     Pt with foot pain. Xrays are normal. Likely has contusion or mild sprain. RICE advised. ACE applied. Will d/c with Podiatry f/u.  Final Clinical Impressions(s) / ED Diagnoses   Final diagnoses:  Contusion of right foot, initial encounter    New Prescriptions Current Discharge Medication  List       Derwood Kaplan, MD 02/10/17 [phone]

## 2018-03-12 IMAGING — CR DG FOOT 2V*R*
2 series · 2 of 2 positions shown · non-contrast
Comparison: None.

CLINICAL DATA: Patient missed a step 2 months ago. Right foot pain
since then.

EXAM:
RIGHT FOOT - 2 VIEW

[foot ap]
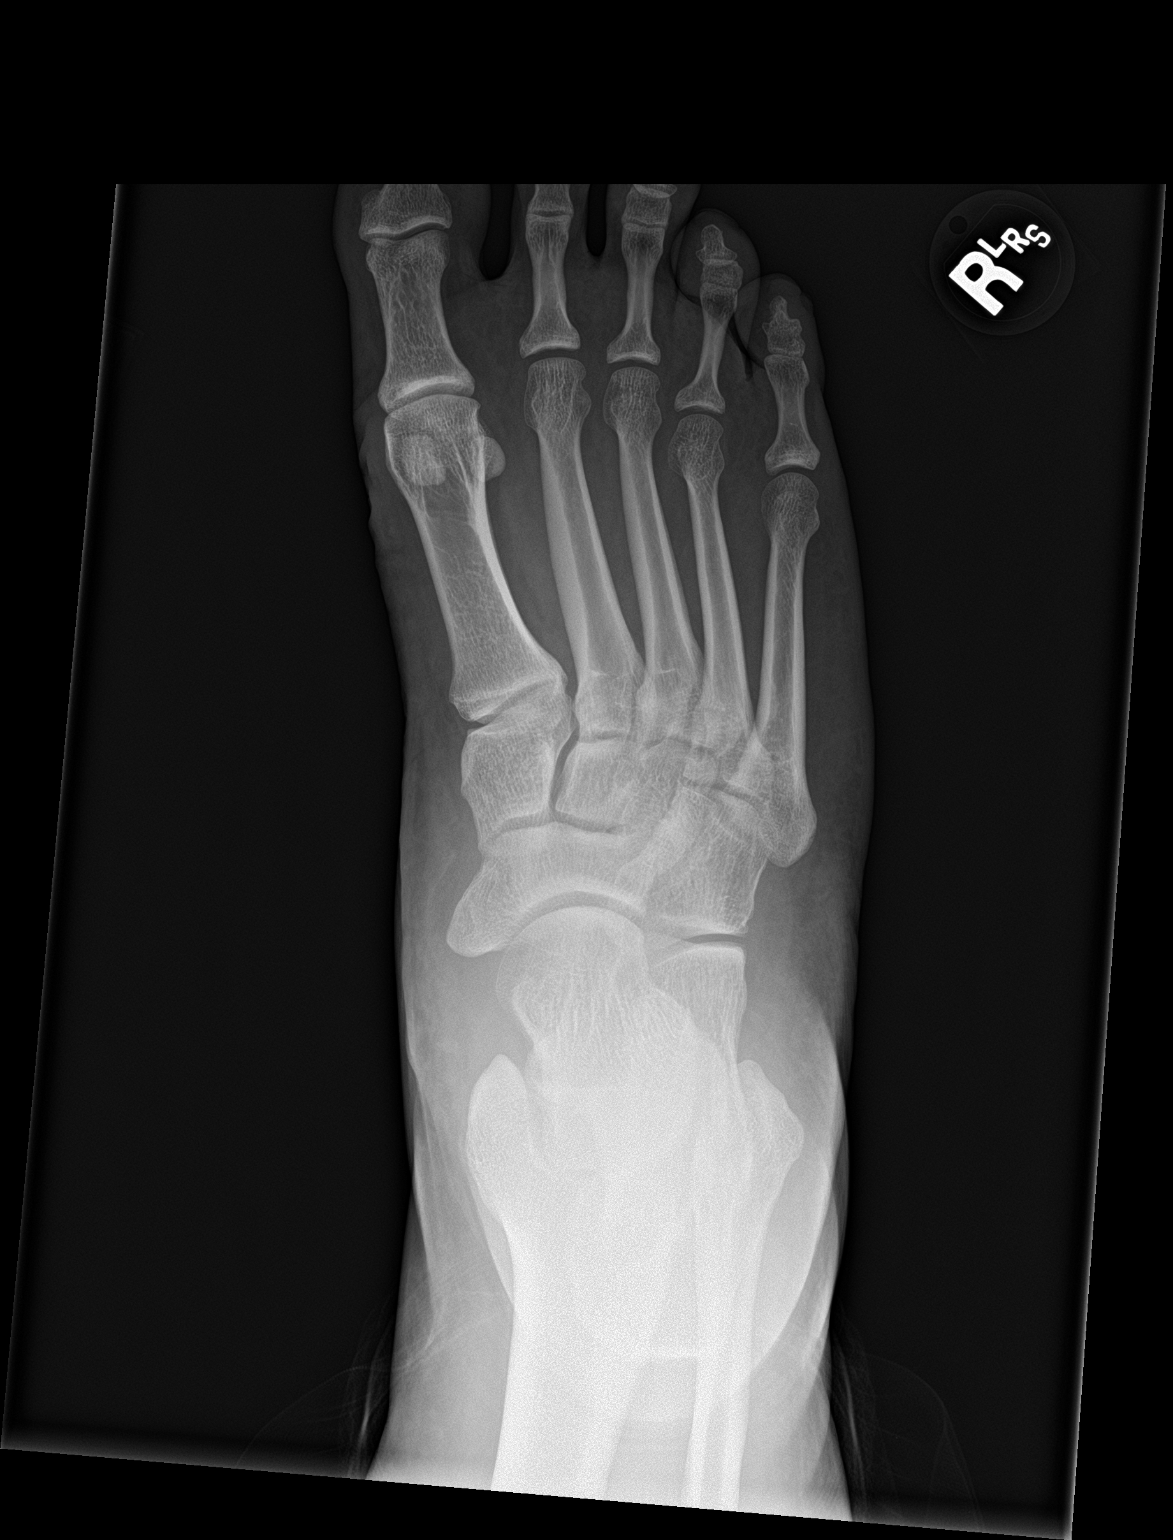

[foot lat]
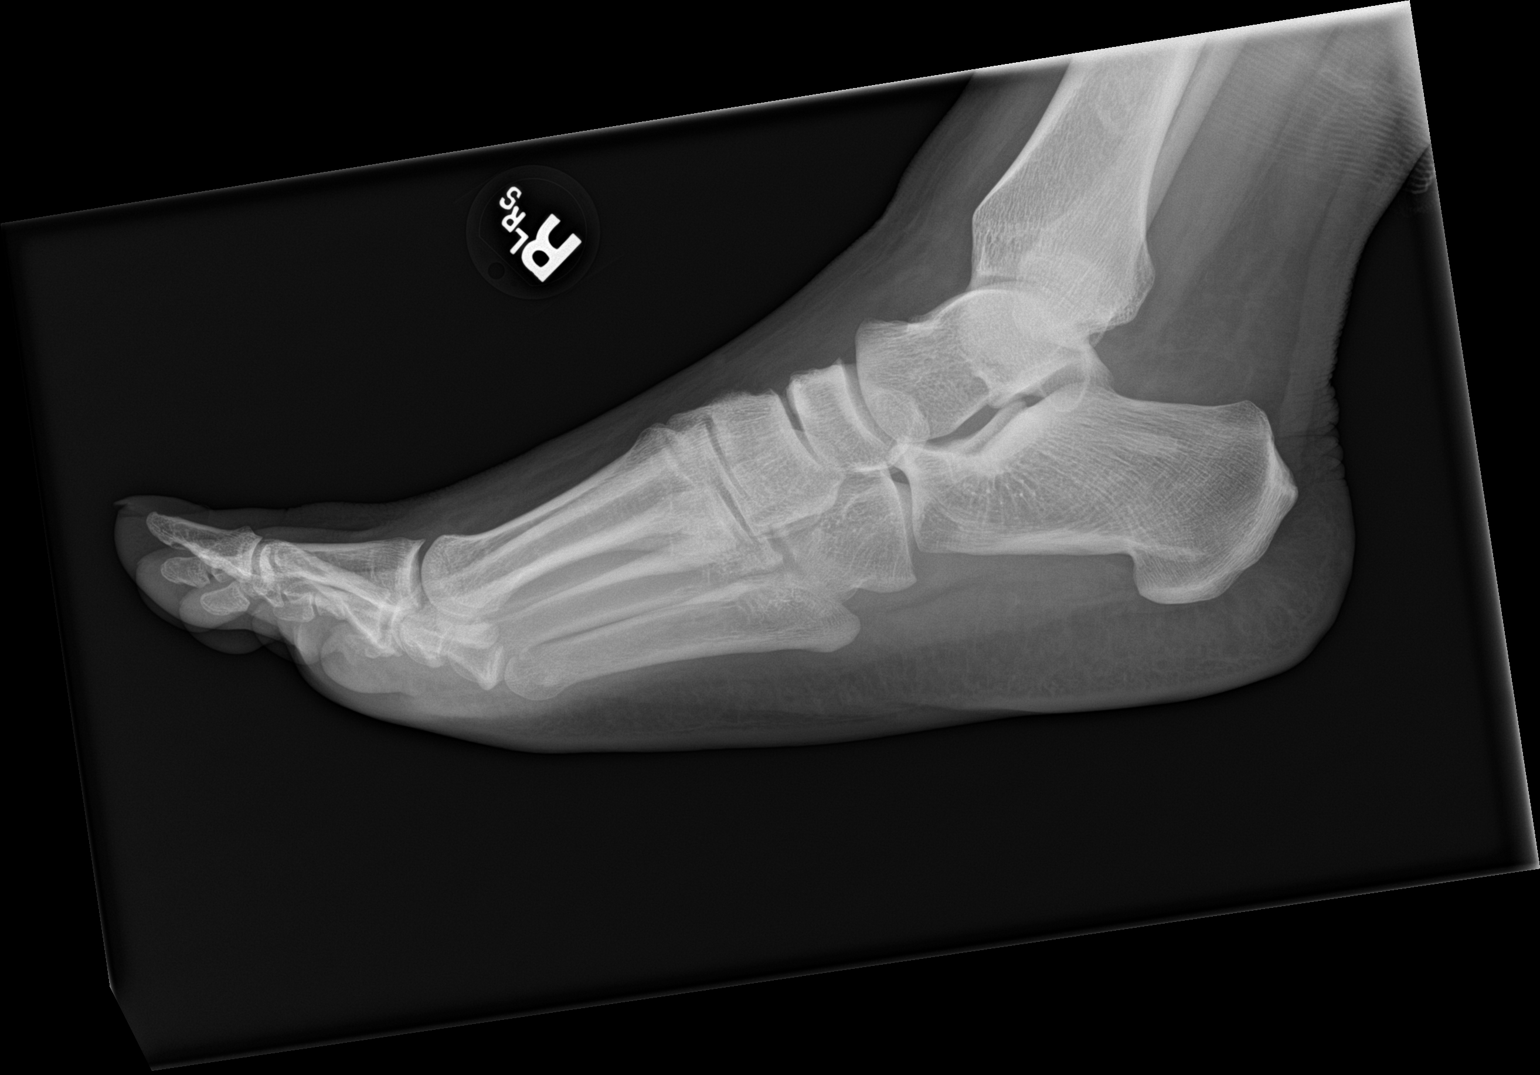

[2 of 2 positions shown; findings below may reference images not displayed]

FINDINGS: There is no evidence of fracture or dislocation. There is no
evidence of arthropathy or other focal bone abnormality. Soft
tissues are unremarkable.
IMPRESSION: Negative.

## 2019-04-01 ENCOUNTER — Other Ambulatory Visit: Payer: Self-pay

## 2019-04-01 ENCOUNTER — Encounter (HOSPITAL_COMMUNITY): Payer: Self-pay | Admitting: Emergency Medicine

## 2019-04-01 ENCOUNTER — Emergency Department (HOSPITAL_COMMUNITY)
Admission: EM | Admit: 2019-04-01 | Discharge: 2019-04-01 | Disposition: A | Discharge status: Home / Self Care | Payer: HRSA Program | Attending: Emergency Medicine | Admitting: Emergency Medicine

## 2019-04-01 DIAGNOSIS — U071 COVID-19: Secondary | ICD-10-CM | POA: Diagnosis not present

## 2019-04-01 DIAGNOSIS — B349 Viral infection, unspecified: Secondary | ICD-10-CM

## 2019-04-01 DIAGNOSIS — Z79899 Other long term (current) drug therapy: Secondary | ICD-10-CM | POA: Insufficient documentation

## 2019-04-01 DIAGNOSIS — R509 Fever, unspecified: Secondary | ICD-10-CM | POA: Diagnosis present

## 2019-04-01 DIAGNOSIS — F17210 Nicotine dependence, cigarettes, uncomplicated: Secondary | ICD-10-CM | POA: Insufficient documentation

## 2019-04-01 MED ORDER — ACETAMINOPHEN 325 MG PO TABS
650.0000 mg | ORAL_TABLET | Freq: Once | ORAL | Status: AC
  Administered 2019-04-01: 22:00:00 650 mg via ORAL
  Filled 2019-04-01: qty 2

## 2019-04-01 NOTE — ED Provider Notes (Signed)
[facility] Provider Note   CSN: [id number] Arrival date & time: 04/01/19  2030    History   Chief Complaint Chief Complaint  Patient presents with  . Fever    HPI Patrick Montes is a 28 y.o. male.     The history is provided by the patient.  Fever Temp source:  Subjective Severity:  Mild Onset quality:  Gradual Timing:  Constant Progression:  Waxing and waning Chronicity:  New Relieved by:  Nothing Worsened by:  Nothing Associated symptoms: chills and headaches   Associated symptoms: no chest pain, no cough, no dysuria, no ear pain, no rash, no sore throat and no vomiting   Risk factors: no sick contacts     Past Medical History:  Diagnosis Date  . Obesity     There are no active problems to display for this patient.   History reviewed. No pertinent surgical history.      Home Medications    Prior to Admission medications   Medication Sig Start Date End Date Taking? Authorizing Provider  acetaminophen (TYLENOL) 500 MG tablet Take 500 mg by mouth every 8 (eight) hours as needed for headache.    [provider]  ibuprofen (ADVIL,MOTRIN) 600 MG tablet Take 1 tablet (600 mg total) by mouth every 6 (six) hours as needed. 12/17/16   Derwood KaplanNanavati, Ankit, MD  ibuprofen (ADVIL,MOTRIN) 600 MG tablet Take 1 tablet (600 mg total) by mouth every 6 (six) hours as needed. 02/10/17   Derwood KaplanNanavati, Ankit, MD  traMADol (ULTRAM) 50 MG tablet Take 1 tablet (50 mg total) by mouth every 6 (six) hours as needed. 01/05/15   Lurene ShadowPhelps, Erin O, PA-C    Family History History reviewed. No pertinent family history.  Social History Social History   Tobacco Use  . Smoking status: Current Every Day Smoker    Types: Cigars  . Smokeless tobacco: Never Used  . Tobacco comment: Black and Miles  Substance Use Topics  . Alcohol use: Yes  . Drug use: Not Currently    Types: Marijuana     Allergies   Patient has no known allergies.   Review of  Systems Review of Systems  Constitutional: Positive for chills and fever.  HENT: Negative for ear pain and sore throat.   Eyes: Negative for pain and visual disturbance.  Respiratory: Negative for cough and shortness of breath.   Cardiovascular: Negative for chest pain and palpitations.  Gastrointestinal: Negative for abdominal pain and vomiting.  Genitourinary: Negative for dysuria and hematuria.  Musculoskeletal: Negative for arthralgias and back pain.  Skin: Negative for color change and rash.  Neurological: Positive for headaches. Negative for seizures and syncope.  All other systems reviewed and are negative.    Physical Exam Updated Vital Signs BP 130/72   Pulse (!) 110   Temp 100.1 F (37.8 C) (Oral)   Resp 14   Ht 6' 2.5" (1.892 m)   Wt (!) 158.8 kg   SpO2 95%   BMI 44.34 kg/m   Physical Exam Vitals signs and nursing note reviewed.  Constitutional:      General: Patrick Montes is not in acute distress.    Appearance: Patrick Montes is well-developed.  HENT:     Head: Normocephalic and atraumatic.     Nose: Nose normal.     Mouth/Throat:     Mouth: Mucous membranes are moist.  Eyes:     Extraocular Movements: Extraocular movements intact.     Conjunctiva/sclera: Conjunctivae normal.  Pupils: Pupils are equal, round, and reactive to light.  Neck:     Musculoskeletal: Normal range of motion and neck supple. No muscular tenderness.  Cardiovascular:     Rate and Rhythm: Normal rate and regular rhythm.     Pulses: Normal pulses.     Heart sounds: Normal heart sounds. No murmur.  Pulmonary:     Effort: Pulmonary effort is normal. No respiratory distress.     Breath sounds: Normal breath sounds.  Abdominal:     Palpations: Abdomen is soft.     Tenderness: There is no abdominal tenderness.  Musculoskeletal: Normal range of motion.  Skin:    General: Skin is warm and dry.  Neurological:     General: No focal deficit present.     Mental Status: Patrick Montes is alert.      ED Treatments  / Results  Labs (all labs ordered are listed, but only abnormal results are displayed) Labs Reviewed  NOVEL CORONAVIRUS, NAA (HOSPITAL ORDER, SEND-OUT TO REF LAB)    EKG None  Radiology No results found.  Procedures Procedures (including critical care time)  Medications Ordered in ED Medications  acetaminophen (TYLENOL) tablet 650 mg (has no administration in time range)     Initial Impression / Assessment and Plan / ED Course  I have reviewed the triage vital signs and the nursing notes.  Pertinent labs & imaging results that were available during my care of the patient were reviewed by me and considered in my medical decision making (see chart for details).        Patrick Montes is a 28 year old male with no significant medical history who presents to the ED with fever, chills.  Patient wants coronavirus testing.  Temperature is 100.1.  Mild tachycardia.  No signs of dehydration.  Patient has mild headache.  No signs to suggest meningitis.  No sputum production.  No shortness of breath.  Patient overall is well-appearing.  Will swab for coronavirus. Patient discharged from the ED in good condition.  Understands self-isolation.  Educated about coronavirus.  Given return precautions.  This chart was dictated using voice recognition software.  Despite best efforts to proofread,  errors can occur which can change the documentation meaning.  Patrick Montes was evaluated in Emergency Department on 04/01/2019 for the symptoms described in the history of present illness. Patrick Montes was evaluated in the context of the global COVID-19 pandemic, which necessitated consideration that the patient might be at risk for infection with the SARS-CoV-2 virus that causes COVID-19. Institutional protocols and algorithms that pertain to the evaluation of patients at risk for COVID-19 are in a state of rapid change based on information released by regulatory bodies including the CDC and federal and state  organizations. These policies and algorithms were followed during the patient's care in the ED.     Lennice Sites, DO 04/01/19 2118

## 2019-04-01 NOTE — ED Triage Notes (Signed)
Pt reports he has a headache, feels nauseous, has had one episode of diarrhea(one day), and vomited one time today. Pt denies CP, Abd pain, and SHOB. Pt reports headache is intermittent and rates it at a 2.

## 2019-04-01 NOTE — Discharge Instructions (Addendum)
Patrick Montes was evaluated in Emergency Department on 04/01/2019 for the symptoms described in the history of present illness. He was evaluated in the context of the global COVID-19 pandemic, which necessitated consideration that the patient might be at risk for infection with the SARS-CoV-2 virus that causes COVID-19. Institutional protocols and algorithms that pertain to the evaluation of patients at risk for COVID-19 are in a state of rapid change based on information released by regulatory bodies including the CDC and federal and state organizations. These policies and algorithms were followed during the patient's care in the ED.

## 2019-04-03 LAB — NOVEL CORONAVIRUS, NAA (HOSP ORDER, SEND-OUT TO REF LAB; TAT 18-24 HRS): SARS-CoV-2, NAA: DETECTED — AB

## 2019-04-16 ENCOUNTER — Other Ambulatory Visit: Payer: Self-pay

## 2019-04-16 ENCOUNTER — Emergency Department (HOSPITAL_COMMUNITY)
Admission: EM | Admit: 2019-04-16 | Discharge: 2019-04-16 | Disposition: A | Discharge status: Home / Self Care | Payer: Self-pay | Attending: Emergency Medicine | Admitting: Emergency Medicine

## 2019-04-16 DIAGNOSIS — Z0000 Encounter for general adult medical examination without abnormal findings: Secondary | ICD-10-CM | POA: Insufficient documentation

## 2019-04-16 NOTE — ED Provider Notes (Signed)
[facility]   CSN: [id number] Arrival date & time: 04/16/19  2016    History   Chief Complaint Chief Complaint  Patient presents with  . Follow-up    HPI Katy FitchCalvin Carrigg is a 28 y.o. male with history of obesity who presents for evaluation to return to work after testing positive for cocaine 2 weeks ago.  Patient has been asymptomatic for 1 week.  He denies any symptoms today and just needs to be rechecked and a work note.  He denies any fever in the last week.  He is feeling well.     HPI  Past Medical History:  Diagnosis Date  . Obesity     There are no active problems to display for this patient.   No past surgical history on file.      Home Medications    Prior to Admission medications   Medication Sig Start Date End Date Taking? Authorizing Provider  acetaminophen (TYLENOL) 500 MG tablet Take 500 mg by mouth every 8 (eight) hours as needed for headache.    [provider]  ibuprofen (ADVIL,MOTRIN) 600 MG tablet Take 1 tablet (600 mg total) by mouth every 6 (six) hours as needed. 12/17/16   Derwood KaplanNanavati, Ankit, MD  ibuprofen (ADVIL,MOTRIN) 600 MG tablet Take 1 tablet (600 mg total) by mouth every 6 (six) hours as needed. 02/10/17   Derwood KaplanNanavati, Ankit, MD  traMADol (ULTRAM) 50 MG tablet Take 1 tablet (50 mg total) by mouth every 6 (six) hours as needed. 01/05/15   Lurene ShadowPhelps, Erin O, PA-C    Family History No family history on file.  Social History Social History   Tobacco Use  . Smoking status: Current Every Day Smoker    Types: Cigars  . Smokeless tobacco: Never Used  . Tobacco comment: Black and Miles  Substance Use Topics  . Alcohol use: Yes  . Drug use: Not Currently    Types: Marijuana     Allergies   Patient has no known allergies.   Review of Systems Review of Systems  Constitutional: Negative for fever.  Respiratory: Negative for cough and shortness of breath.      Physical Exam Updated  Vital Signs BP (!) 137/109 (BP Location: Right Arm)   Pulse 94   Temp 98.5 F (36.9 C) (Oral)   Resp 18   SpO2 100%   Physical Exam Vitals signs and nursing note reviewed.  Constitutional:      General: He is not in acute distress.    Appearance: He is well-developed. He is not diaphoretic.  HENT:     Head: Normocephalic and atraumatic.     Mouth/Throat:     Pharynx: No oropharyngeal exudate.  Eyes:     General: No scleral icterus.       Right eye: No discharge.        Left eye: No discharge.     Conjunctiva/sclera: Conjunctivae normal.     Pupils: Pupils are equal, round, and reactive to light.  Neck:     Musculoskeletal: Normal range of motion and neck supple.     Thyroid: No thyromegaly.  Cardiovascular:     Rate and Rhythm: Normal rate and regular rhythm.     Heart sounds: Normal heart sounds. No murmur. No friction rub. No gallop.   Pulmonary:     Effort: Pulmonary effort is normal. No respiratory distress.     Breath sounds: Normal breath sounds. No stridor. No wheezing or rales.  Abdominal:  General: Bowel sounds are normal. There is no distension.     Palpations: Abdomen is soft.     Tenderness: There is no abdominal tenderness. There is no guarding or rebound.  Lymphadenopathy:     Cervical: No cervical adenopathy.  Skin:    General: Skin is warm and dry.     Coloration: Skin is not pale.     Findings: No rash.  Neurological:     Mental Status: He is alert.     Coordination: Coordination normal.      ED Treatments / Results  Labs (all labs ordered are listed, but only abnormal results are displayed) Labs Reviewed - No data to display  EKG None  Radiology No results found.  Procedures Procedures (including critical care time)  Medications Ordered in ED Medications - No data to display   Initial Impression / Assessment and Plan / ED Course  I have reviewed the triage vital signs and the nursing notes.  Pertinent labs & imaging results  that were available during my care of the patient were reviewed by me and considered in my medical decision making (see chart for details).        Patient presents for clearance to go back to work after testing positive for COVID-19.  Patient has been asymptomatic for week.  He is well-appearing with stable vitals.  Patient given work note to return.  Jamerius Boeckman was evaluated in Emergency Department on 04/16/2019 for the symptoms described in the history of present illness. He was evaluated in the context of the global COVID-19 pandemic, which necessitated consideration that the patient might be at risk for infection with the SARS-CoV-2 virus that causes COVID-19. Institutional protocols and algorithms that pertain to the evaluation of patients at risk for COVID-19 are in a state of rapid change based on information released by regulatory bodies including the CDC and federal and state organizations. These policies and algorithms were followed during the patient's care in the ED.   Final Clinical Impressions(s) / ED Diagnoses   Final diagnoses:  Encounter for well adult exam without abnormal findings    ED Discharge Orders    None       Frederica Kuster, PA-C 04/16/19 2143    Lajean Saver, MD 04/16/19 2249

## 2019-04-16 NOTE — Discharge Instructions (Signed)
You are cleared to return to work.

## 2019-04-16 NOTE — ED Triage Notes (Signed)
Pt tested covid + 2 weeks ago. Has been asymptomatic for over one week. Patient looks well. He denies any sx just needs a recheck to go back to work.

## 2019-05-05 ENCOUNTER — Emergency Department (HOSPITAL_COMMUNITY): Payer: HRSA Program

## 2019-05-05 ENCOUNTER — Other Ambulatory Visit: Payer: Self-pay

## 2019-05-05 ENCOUNTER — Emergency Department (HOSPITAL_COMMUNITY)
Admission: EM | Admit: 2019-05-05 | Discharge: 2019-05-05 | Disposition: A | Discharge status: Home / Self Care | Payer: HRSA Program | Attending: Emergency Medicine | Admitting: Emergency Medicine

## 2019-05-05 ENCOUNTER — Encounter (HOSPITAL_COMMUNITY): Payer: Self-pay

## 2019-05-05 DIAGNOSIS — R0602 Shortness of breath: Secondary | ICD-10-CM | POA: Diagnosis present

## 2019-05-05 DIAGNOSIS — Z20828 Contact with and (suspected) exposure to other viral communicable diseases: Secondary | ICD-10-CM | POA: Insufficient documentation

## 2019-05-05 DIAGNOSIS — F17210 Nicotine dependence, cigarettes, uncomplicated: Secondary | ICD-10-CM | POA: Insufficient documentation

## 2019-05-05 DIAGNOSIS — U071 COVID-19: Secondary | ICD-10-CM

## 2019-05-05 DIAGNOSIS — R072 Precordial pain: Secondary | ICD-10-CM | POA: Diagnosis not present

## 2019-05-05 DIAGNOSIS — R739 Hyperglycemia, unspecified: Secondary | ICD-10-CM | POA: Diagnosis not present

## 2019-05-05 LAB — D-DIMER, QUANTITATIVE: D-Dimer, Quant: <0.27 ug/mL-FEU (ref 0.00–0.50)

## 2019-05-05 LAB — BASIC METABOLIC PANEL
Anion gap: 10 (ref 5–15)
BUN: 8 mg/dL (ref 6–20)
CO2: 22 mmol/L (ref 22–32)
Calcium: 9.2 mg/dL (ref 8.9–10.3)
Chloride: 104 mmol/L (ref 98–111)
Creatinine, Ser: 0.78 mg/dL (ref 0.61–1.24)
GFR calc Af Amer: >60 mL/min (ref >60)
GFR calc non Af Amer: >60 mL/min (ref >60)
Glucose, Bld: 460 mg/dL — ABNORMAL HIGH (ref 70–99)
Potassium: 3.9 mmol/L (ref 3.5–5.1)
Sodium: 136 mmol/L (ref 135–145)

## 2019-05-05 LAB — CBC
HCT: 45.2 % (ref 39.0–52.0)
Hemoglobin: 14.8 g/dL (ref 13.0–17.0)
MCH: 29.7 pg (ref 26.0–34.0)
MCHC: 32.7 g/dL (ref 30.0–36.0)
MCV: 90.6 fL (ref 80.0–100.0)
Platelets: 246 10*3/uL (ref 150–400)
RBC: 4.99 MIL/uL (ref 4.22–5.81)
RDW: 12.9 % (ref 11.5–15.5)
WBC: 5.1 10*3/uL (ref 4.0–10.5)
nRBC: 0 % (ref 0.0–0.2)

## 2019-05-05 LAB — TROPONIN I (HIGH SENSITIVITY): Troponin I (High Sensitivity): 5 ng/L (ref <18)

## 2019-05-05 MED ORDER — SODIUM CHLORIDE 0.9 % IV BOLUS
1000.0000 mL | Freq: Once | INTRAVENOUS | Status: AC
  Administered 2019-05-05: 1000 mL via INTRAVENOUS

## 2019-05-05 MED ORDER — SODIUM CHLORIDE 0.9% FLUSH: 3.0000 mL | Freq: Once | INTRAVENOUS | Status: DC

## 2019-05-05 MED ORDER — METFORMIN HCL 500 MG PO TABS: 500.0000 mg | ORAL_TABLET | Freq: Two times a day (BID) | ORAL | 1 refills | Status: DC

## 2019-05-05 NOTE — Discharge Instructions (Signed)
Take your medications as prescribed.  You were retested for COVID.  Please not return to work until those results.  I have written you a work note.  You will need to follow-up with a primary care provider for assessment of your diabetes.

## 2019-05-05 NOTE — ED Provider Notes (Addendum)
[facility]ENC[facility] Provider Note   CSN: [id number] Arrival date & time: 05/05/19  1613  History   Chief Complaint Chief Complaint  Patient presents with  . Chest Pain  . Shortness of Breath    HPI Patrick Montes is a 28 y.o. male with past medical history significant for obesity who presents for evaluation of chest pain shortness of breath.  Patient states he has had chest pain shortness of breath x3 days.  Patient states he has had nonproductive cough.  Had tested positive for COVID approximately 4 weeks ago.  Was seen on July 13 and was told to go back to work.  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  Chest pain nonexertional in nature.  Worse with shortness of breath.  He denies hemoptysis, lower extremity edema, erythema, ecchymosis, warmth to LE. No history of PE or DVT.  Denies associated diaphoresis, lightheadedness, dizziness, nausea or vomiting.  Tolerating p.o. intake without difficulty.  He rates his current chest pain a 4/10.  Pain worse when he palpates his chest wall.  Pain does not radiate into his left arm, jaw or back.  Denies history of hypertension, hyperlipidemia, diabetes, family history of MI at early age.  Has not taken anything for his pain.  History of pain from patient past medical records.  Interpreter is used.     HPI  Past Medical History:  Diagnosis Date  . Obesity     There are no active problems to display for this patient.   History reviewed. No pertinent surgical history.      Home Medications    Prior to Admission medications   Medication Sig Start Date End Date Taking? Authorizing Provider  acetaminophen (TYLENOL) 500 MG tablet Take 500 mg by mouth every 8 (eight) hours as needed for headache.    [provider]  ibuprofen (ADVIL,MOTRIN) 600 MG tablet Take 1 tablet (600 mg total) by mouth every 6  (six) hours as needed. 12/17/16   Derwood KaplanNanavati, Ankit, MD  ibuprofen (ADVIL,MOTRIN) 600 MG tablet Take 1 tablet (600 mg total) by mouth every 6 (six) hours as needed. 02/10/17   Derwood KaplanNanavati, Ankit, MD  metFORMIN (GLUCOPHAGE) 500 MG tablet Take 1 tablet (500 mg total) by mouth 2 (two) times daily with a meal. 05/05/19   Wassim Kirksey A, PA-C  traMADol (ULTRAM) 50 MG tablet Take 1 tablet (50 mg total) by mouth every 6 (six) hours as needed. 01/05/15   Lurene ShadowPhelps, Erin O, PA-C    Family History No family history on file.  Social History Social History   Tobacco Use  . Smoking status: Current Every Day Smoker    Types: Cigars  . Smokeless tobacco: Never Used  . Tobacco comment: Black and Miles  Substance Use Topics  . Alcohol use: Yes  . Drug use: Not Currently    Types: Marijuana     Allergies   Patient has no known allergies.   Review of Systems Review of Systems  Constitutional: Negative.   HENT: Negative.   Respiratory: Positive for cough and shortness of breath. Negative for apnea, choking, chest tightness, wheezing and stridor.   Cardiovascular: Positive for chest pain. Negative for palpitations and leg swelling.  Gastrointestinal: Negative.   Genitourinary: Negative.   Musculoskeletal: Negative.   Skin: Negative.   Neurological: Negative.   All other systems reviewed and are negative.    Physical  Exam Updated Vital Signs BP (!) 143/78 (BP Location: Right Arm)   Pulse 83   Temp 98.3 F (36.8 C) (Oral)   Resp 18   SpO2 97%   Physical Exam Vitals signs and nursing note reviewed.  Constitutional:      General: He is not in acute distress.    Appearance: He is not ill-appearing, toxic-appearing or diaphoretic.  HENT:     Head: Normocephalic and atraumatic.     Jaw: There is normal jaw occlusion.     Nose: Nose normal.  Eyes:     Extraocular Movements: Extraocular movements intact.  Neck:     Musculoskeletal: Full passive range of motion without pain, normal range of  motion and neck supple.     Vascular: No carotid bruit or JVD.     Trachea: Trachea and phonation normal.     Meningeal: Brudzinski's sign and Kernig's sign absent.  Cardiovascular:     Rate and Rhythm: Normal rate.     Pulses: Normal pulses.          Radial pulses are 2+ on the right side and 2+ on the left side.       Posterior tibial pulses are 2+ on the right side and 2+ on the left side.     Heart sounds: Normal heart sounds.  Pulmonary:     Effort: Pulmonary effort is normal.     Breath sounds: Normal breath sounds and air entry.     Comments: Clear to auscultation bilateral without wheeze, rhonchi or rales.  Speaks in full sentences without difficulty.  Ambulatory in room with oxygen saturations greater than 95% on room air. Chest:     Chest wall: No deformity, swelling, tenderness, crepitus or edema.       Comments: Diffuse tenderness palpation across anterior chest wall.  No evidence of crepitus, deformity or overlying skin changes. Abdominal:     Palpations: Abdomen is soft.     Tenderness: There is no abdominal tenderness. There is no right CVA tenderness, left CVA tenderness, guarding or rebound.     Hernia: No hernia is present.  Musculoskeletal:     Right lower leg: No edema.     Left lower leg: No edema.     Comments: No edema, erythema, ecchymosis or warmth.  Full range of motion bilateral lower extremity without difficulty.  Homans sign negative.  Feet:     Right foot:     Skin integrity: Skin integrity normal.     Left foot:     Skin integrity: Skin integrity normal.  Skin:    General: Skin is warm.     Capillary Refill: Capillary refill takes less than 2 seconds.     Comments: No rashes or lesions.  Brisk capillary refill.  Neurological:     General: No focal deficit present.     Mental Status: He is alert and oriented to person, place, and time.     Comments: Cranial nerves II through XII grossly intact.  No facial droop.  Ambulatory that difficulty.     ED Treatments / Results  Labs (all labs ordered are listed, but only abnormal results are displayed) Labs Reviewed  BASIC METABOLIC PANEL - Abnormal; Notable for the following components:      Result Value   Glucose, Bld 460 (*)    All other components within normal limits  NOVEL CORONAVIRUS, NAA (HOSPITAL ORDER, SEND-OUT TO REF LAB)  CBC  D-DIMER, QUANTITATIVE (NOT AT [facility])  TROPONIN I (HIGH SENSITIVITY)  EKG EKG Interpretation  Date/Time:  Saturday May 05 2019 16:21:30 EDT Ventricular Rate:  103 PR Interval:  210 QRS Duration: 100 QT Interval:  336 QTC Calculation: 440 R Axis:   -166 Text Interpretation:  Sinus tachycardia with 1st degree A-V block Right superior axis deviation 9 Abnormal ECG Confirmed by Lennice Sites [phone]) on 05/05/2019 4:30:03 PM   Radiology Dg Chest Portable 1 View  Result Date: 05/05/2019 CLINICAL DATA:  Upper center and left side chest pain, at the level of the left clavicle x4 days. SOB x4 days. Pt has hx of positive COVID test x4 weeks ago, but he only experienced headache and nausea at that time. Smoker.cp sob, covid 4 weeks ago EXAM: PORTABLE CHEST 1 VIEW COMPARISON:  None. FINDINGS: Normal mediastinum and cardiac silhouette. Normal pulmonary vasculature. No evidence of effusion, infiltrate, or pneumothorax. No acute bony abnormality. IMPRESSION: Normal chest radiograph. Electronically Signed   By: Suzy Bouchard M.D.   On: 05/05/2019 17:37    Procedures Procedures (including critical care time)  Medications Ordered in ED Medications  sodium chloride flush (NS) 0.9 % injection 3 mL (3 mLs Intravenous Not Given 05/05/19 1722)  sodium chloride 0.9 % bolus 1,000 mL (1,000 mLs Intravenous New Bag/Given 05/05/19 1722)   Initial Impression / Assessment and Plan / ED Course  I have reviewed the triage vital signs and the nursing notes.  Pertinent labs & imaging results that were available during my care of the patient were reviewed by me and  considered in my medical decision making (see chart for details).  28 year old male appears otherwise well presents for evaluation of chest pain shortness of breath.  Onset 3 days ago.  Anterior chest wall pain with tenderness palpation to his chest.  He has no overlying skin changes, crepitus or deformity.  Shortness of breath is intermittent and pleuritic in nature.  He has no evidence of DVT on exam.  He is mildly tachycardic at 107 initial evaluation.  He cannot PERC.  Will obtain d-dimer.  Additional labs obtained from triage.  He did test positive for COVID 4 weeks ago.  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  He continues to have mild rhinorrhea productive cough without hemoptysis.  Congestion, rhinorrhea or fever.  His heart and lungs are clear.  No lower extremity edema, erythema, ecchymosis or warmth. Hearts score -1 (obesity)   Labs and imaging personally reviewed: CBC without leukocytosis Troponin negative. Given pain x 3 days do not feel need to obtain delta troponin at this time.  If likely ACS his troponin would be elevated currently. Metabolic panel hyperglycemia at 460.  No prior history of diabetes.  No elevated anion gap to suggest DKA.  Will give a liter of fluids.  Patient denies polyuria polydipsia. D-dimer negative Chest x-ray without infiltrates, cardiomegaly, pulmonary edema, pneumothorax. EKG with sinus tachycardia possible first-degree AV block.No ST/T changes.  No STEMI.  Patient unwilling to provide urinalysis to assess for urine ketones. Discussed risk vs benefit however patient declines.  1800: Anterior chest wall pain reproducible to palpation.  Given negative troponin d-dimer however low suspicion for ACS, PE dissection, myocarditis, pericarditis. Chest pain nonexertional without radiation.  Plan follow-up with PCP for reevaluation.  Patient  with elevated blood sugar at 460.  No evidence of DKA, HHS.  He was given IV fluids.  Discussed with patient recheck of  blood sugar to ensure trending down however patient declines.  Discussed risk versus benefit.  Patient voiced understanding however requesting DC home without recheck of his CBG. Will start patient on metformin and follow up closely with PCP.  Discussed strict return precautions.  Will give follow-up with PCP for elevated glucose level.  His employer is also requesting COVID screening given his new symptoms.  Will order.  Patient will home isolate until his returns.  Patient is to be discharged with recommendation to follow up with PCP in regards to today's hospital visit. VSS, no tracheal deviation, no JVD or new murmur, RRR, breath sounds equal bilaterally, EKG without acute abnormalities, negative troponin, and negative CXR. Pt has been advised to return to the ED if CP becomes exertional, associated with diaphoresis or nausea, radiates to left jaw/arm, worsens or becomes concerning in any way.   The patient has been appropriately medically screened and/or stabilized in the ED. I have low suspicion for any other emergent medical condition which would require further screening, evaluation or treatment in the ED or require inpatient management.  Patient is hemodynamically stable and in no acute distress.  Patient able to ambulate in department prior to ED.  Evaluation does not show acute pathology that would require ongoing or additional emergent interventions while in the emergency department or further inpatient treatment.  I have discussed the diagnosis with the patient and answered all questions.  Pain is been managed while in the emergency department and patient has no further complaints prior to discharge.  Patient is comfortable with plan discussed in room and is stable for discharge at this time.  I have discussed strict return precautions for returning to the emergency department.   Patient was encouraged to follow-up with PCP/specialist refer to at discharge.  Patient noted to be hypertensive in the emergency department.  No signs of hypertensive urgency.  Discussed with patient the need for close follow-up and management by their primary care physician.      Final Clinical Impressions(s) / ED Diagnoses   Final diagnoses:  Precordial chest pain  COVID-19 virus infection  Hyperglycemia    ED Discharge Orders         Ordered    metFORMIN (GLUCOPHAGE) 500 MG tablet  2 times daily with meals     05/05/19 1817           Dae Antonucci A, PA-C 05/05/19 1821    Annetta Deiss A, PA-C 05/05/19 1831    Vanetta MuldersZackowski, Scott, MD 05/11/19 1551

## 2019-05-05 NOTE — ED Notes (Signed)
Patient verbalizes understanding of discharge instructions. Opportunity for questioning and answers were provided. Armband removed by staff, pt discharged from ED.  

## 2019-05-05 NOTE — ED Triage Notes (Signed)
Pt reports CP and Sob for the past few days, tested positive for COVID in June. Denies fever or cough at this time, resp e.u

## 2019-05-06 LAB — NOVEL CORONAVIRUS, NAA (HOSP ORDER, SEND-OUT TO REF LAB; TAT 18-24 HRS): SARS-CoV-2, NAA: NOT DETECTED

## 2019-05-09 ENCOUNTER — Emergency Department (HOSPITAL_COMMUNITY)
Admission: EM | Admit: 2019-05-09 | Discharge: 2019-05-09 | Disposition: A | Discharge status: Home / Self Care | Payer: Self-pay | Attending: Emergency Medicine | Admitting: Emergency Medicine

## 2019-05-09 ENCOUNTER — Other Ambulatory Visit: Payer: Self-pay

## 2019-05-09 ENCOUNTER — Emergency Department (HOSPITAL_COMMUNITY): Payer: Self-pay

## 2019-05-09 DIAGNOSIS — F17290 Nicotine dependence, other tobacco product, uncomplicated: Secondary | ICD-10-CM | POA: Insufficient documentation

## 2019-05-09 DIAGNOSIS — M25572 Pain in left ankle and joints of left foot: Secondary | ICD-10-CM

## 2019-05-09 DIAGNOSIS — M79672 Pain in left foot: Secondary | ICD-10-CM

## 2019-05-09 DIAGNOSIS — Z7984 Long term (current) use of oral hypoglycemic drugs: Secondary | ICD-10-CM | POA: Insufficient documentation

## 2019-05-09 NOTE — Discharge Instructions (Addendum)
1. Medications: You can take 1 to 2 tablets of Tylenol every 6 hours as needed for pain.  Do not exceed 4000 mg of Tylenol daily.  If your pain persists you can take a doses of ibuprofen in between.  I usually recommend 400 to 600 mg of ibuprofen every 6 hours.  Take this with food to avoid upset stomach issues. 2. Treatment: rest, ice, elevate and use brace and crutches, drink plenty of fluids, gentle stretching 3. Follow Up: Please followup with orthopedics as directed or your PCP in 1 week if no improvement for discussion of your diagnoses and further evaluation after today's visit; I have given you the information for the [facility] primary care clinics we talked about today.  If you do not have a primary care doctor use the resource guide provided to find one; Please return to the ER for worsening symptoms or other concerns such as worsening swelling, redness of the skin, fevers, loss of pulses, or loss of feeling

## 2019-05-09 NOTE — ED Triage Notes (Signed)
Pt arrives POV from home c/o left foot pain that started approx 3 days. Pt does not recall injuring foot. Pt states he has DM.

## 2019-05-09 NOTE — ED Notes (Signed)
Pt returned to room from xray.

## 2019-05-09 NOTE — ED Provider Notes (Signed)
[facility] Provider Note   CSN: [id number] Arrival date & time: 05/09/19  [id number]    History   Chief Complaint Chief Complaint  Patient presents with  . Foot Pain    HPI Patrick FitchCalvin Gallogly is a 28 y.o. male with history of obesity presenting for evaluation of acute onset, progressively improving right foot and ankle pain.  He is unsure how long the pain has been going on but at least a few days.  He thinks he may be secondary to slamming his foot in a door frame or due to persistent ambulation.  He reports the pain is localized to the medial malleolus and radiates into the dorsum of the midfoot.  It worsens with weightbearing/prolonged ambulation, improves with rest.  Denies numbness or tingling or wounds.  No fevers, swelling, nausea, vomiting.  Reports the pain was a 30/10 in severity yesterday and is now a 6/10 in severity, so is significantly improved per the patient.  However, his aunt recommended that he come to the ED for x-rays for further evaluation so that is why he is here.  Also of note, he was started on metformin 4 days ago when he was seen and evaluated in the ED for atypical chest pain/shortness of breath and was found to be hyperglycemic.  He reports he has been compliant with his medication but has not followed up with a primary care provider yet.      The history is provided by the patient.    Past Medical History:  Diagnosis Date  . Obesity     There are no active problems to display for this patient.   No past surgical history on file.      Home Medications    Prior to Admission medications   Medication Sig Start Date End Date Taking? Authorizing Provider  acetaminophen (TYLENOL) 500 MG tablet Take 500 mg by mouth every 8 (eight) hours as needed for headache.   Yes [provider]  metFORMIN (GLUCOPHAGE) 500 MG tablet Take 1 tablet (500 mg total) by mouth 2 (two) times daily with a meal. 05/05/19  Yes Henderly, Britni  A, PA-C    Family History No family history on file.  Social History Social History   Tobacco Use  . Smoking status: Current Every Day Smoker    Types: Cigars  . Smokeless tobacco: Never Used  . Tobacco comment: Black and Miles  Substance Use Topics  . Alcohol use: Yes  . Drug use: Not Currently    Types: Marijuana     Allergies   Patient has no known allergies.   Review of Systems Review of Systems  Constitutional: Negative for chills and fever.  Gastrointestinal: Negative for abdominal pain, nausea and vomiting.  Musculoskeletal: Positive for arthralgias. Negative for joint swelling.  Skin: Negative for wound.  Neurological: Negative for weakness and numbness.     Physical Exam Updated Vital Signs BP (!) 140/96 (BP Location: Right Arm)   Pulse 97   Temp 98.2 F (36.8 C) (Oral)   Resp 14   SpO2 99%   Physical Exam Vitals signs and nursing note reviewed.  Constitutional:      General: He is not in acute distress.    Appearance: He is well-developed. He is obese.     Comments: Morbidly obese male resting comfortably in chair, texting and scrolling through phone.  HENT:     Head: Normocephalic and atraumatic.  Eyes:     General:  Right eye: No discharge.        Left eye: No discharge.     Conjunctiva/sclera: Conjunctivae normal.  Neck:     Vascular: No JVD.     Trachea: No tracheal deviation.  Cardiovascular:     Rate and Rhythm: Normal rate.     Pulses: Normal pulses.     Comments: 2+ DP/PT pulses bilaterally, Homans sign absent bilaterally, no lower extremity edema, no palpable cords, compartments are soft  Pulmonary:     Effort: Pulmonary effort is normal.  Abdominal:     General: There is no distension.  Musculoskeletal:        General: Tenderness present.     Comments: Tenderness to palpation of the left medial malleolus.  No swelling, erythema, crepitus or deformity.  Pain elicited with dorsiflexion and eversion of the ankle.  5/5  strength of BLE major muscle groups.  No varus or valgus instability, no ligamentous laxity.  Examination of the Achilles tendon is within normal limits.  Skin:    General: Skin is warm and dry.     Findings: No erythema.     Comments: No wounds or skin breakdown to the left foot.  Neurological:     Mental Status: He is alert.     Comments: Fluent speech, no facial droop, sensation intact to soft touch of bilateral lower extremities.  Patient ambulates with antalgic gait but able to heel walk and toe walk with little difficulty.  Psychiatric:        Behavior: Behavior normal.      ED Treatments / Results  Labs (all labs ordered are listed, but only abnormal results are displayed) Labs Reviewed - No data to display  EKG None  Radiology Dg Ankle Complete Left  Result Date: 05/09/2019 CLINICAL DATA:  Left foot pain for 3 days. EXAM: LEFT ANKLE COMPLETE - 3+ VIEW COMPARISON:  None. FINDINGS: There is no evidence of fracture, dislocation, or joint effusion. There is no evidence of arthropathy or other focal bone abnormality. Soft tissues are unremarkable. IMPRESSION: Negative. Electronically Signed   By: Fidela Salisbury M.D.   On: 05/09/2019 08:54   Dg Foot Complete Left  Result Date: 05/09/2019 CLINICAL DATA:  Left foot pain for 3 days. EXAM: LEFT FOOT - COMPLETE 3+ VIEW COMPARISON:  None. FINDINGS: There is no evidence of fracture or dislocation. There is no evidence of arthropathy or other focal bone abnormality. Soft tissues are unremarkable. IMPRESSION: Negative. Electronically Signed   By: Fidela Salisbury M.D.   On: 05/09/2019 08:56    Procedures Procedures (including critical care time)  Medications Ordered in ED Medications - No data to display   Initial Impression / Assessment and Plan / ED Course  I have reviewed the triage vital signs and the nursing notes.  Pertinent labs & imaging results that were available during my care of the patient were reviewed by me and  considered in my medical decision making (see chart for details).        Morbidly obese male with left medial ankle and foot pain for a few days presents to the ED for evaluation.  Thinks it may be related to recent injury but is unsure.  He is afebrile, vital signs are stable.  He is nontoxic in appearance.  He is neurovascularly intact.  Also of note, sent home with a prescription for metformin after recent ED visit showing hyperglycemia and likely diabetes.  Has not been able to follow-up with a PCP yet.  No  evidence of diabetic foot ulcer or diabetic neuropathy on examination today.  Radiographs are negative with no evidence of acute osseous abnormality, fracture, dislocation, osteomyelitis, soft tissue gas or edema.  Doubt septic arthritis or gout given reassuring examination, no constitutional symptoms. Presentation is inconsistent with DVT.  Encouraged patient to continue taking his metformin, will give resources for outpatient follow-up to see a PCP given that he does not have health insurance.  Conservative therapy indicated and discussed with patient.  He was given ASO ankle brace, crutches, discussed ice, gentle stretching, NSAIDs, Tylenol.  He has no constitutional symptoms and I doubt that he is in DKA and he was not in DKA 4 days ago when he was seen for his chest pain.  Discussed strict ED return precautions. Patient verbalized understanding of and agreement with plan and is safe for discharge home at this time.   Final Clinical Impressions(s) / ED Diagnoses   Final diagnoses:  Acute left ankle pain  Foot pain, left    ED Discharge Orders    None       Bennye AlmFawze, Hunner Garcon A, PA-C 05/09/19 [id number]    Tegeler, Canary Brimhristopher J, MD 05/09/19 1600

## 2019-05-09 NOTE — Progress Notes (Signed)
Orthopedic Tech Progress Note Patient Details:  Patrick Montes December 14, 1990 [id number]  Ortho Devices Type of Ortho Device: ASO, Crutches Ortho Device/Splint Location: left Ortho Device/Splint Interventions: Application   Post Interventions Patient Tolerated: Well Instructions Provided: Care of device   [place] Pink 05/09/2019, 9:22 AM

## 2019-05-09 NOTE — ED Notes (Signed)
Patient transported to X-ray 

## 2019-08-29 ENCOUNTER — Telehealth: Payer: Self-pay

## 2019-08-29 NOTE — Telephone Encounter (Signed)
Called patient to do their pre-visit COVID screening.  Call went to voicemail. Unable to do prescreening.  

## 2019-09-03 ENCOUNTER — Ambulatory Visit: Payer: Self-pay

## 2019-09-16 ENCOUNTER — Emergency Department (HOSPITAL_COMMUNITY)
Admission: EM | Admit: 2019-09-16 | Discharge: 2019-09-16 | Disposition: A | Discharge status: Home / Self Care | Payer: Self-pay | Attending: Emergency Medicine | Admitting: Emergency Medicine

## 2019-09-16 ENCOUNTER — Encounter (HOSPITAL_COMMUNITY): Payer: Self-pay | Admitting: Emergency Medicine

## 2019-09-16 ENCOUNTER — Other Ambulatory Visit: Payer: Self-pay

## 2019-09-16 DIAGNOSIS — R739 Hyperglycemia, unspecified: Secondary | ICD-10-CM | POA: Insufficient documentation

## 2019-09-16 DIAGNOSIS — Z79899 Other long term (current) drug therapy: Secondary | ICD-10-CM | POA: Insufficient documentation

## 2019-09-16 DIAGNOSIS — Z20828 Contact with and (suspected) exposure to other viral communicable diseases: Secondary | ICD-10-CM | POA: Insufficient documentation

## 2019-09-16 DIAGNOSIS — R197 Diarrhea, unspecified: Secondary | ICD-10-CM | POA: Insufficient documentation

## 2019-09-16 DIAGNOSIS — Z20822 Contact with and (suspected) exposure to covid-19: Secondary | ICD-10-CM

## 2019-09-16 DIAGNOSIS — F17290 Nicotine dependence, other tobacco product, uncomplicated: Secondary | ICD-10-CM | POA: Insufficient documentation

## 2019-09-16 LAB — CBC
HCT: 48.3 % (ref 39.0–52.0)
Hemoglobin: 16.5 g/dL (ref 13.0–17.0)
MCH: 30.5 pg (ref 26.0–34.0)
MCHC: 34.2 g/dL (ref 30.0–36.0)
MCV: 89.3 fL (ref 80.0–100.0)
Platelets: 239 10*3/uL (ref 150–400)
RBC: 5.41 MIL/uL (ref 4.22–5.81)
RDW: 12.7 % (ref 11.5–15.5)
WBC: 4.8 10*3/uL (ref 4.0–10.5)
nRBC: 0 % (ref 0.0–0.2)

## 2019-09-16 LAB — URINALYSIS, ROUTINE W REFLEX MICROSCOPIC
Bacteria, UA: NONE SEEN
Bilirubin Urine: NEGATIVE
Glucose, UA: >=500 mg/dL — AB
Hgb urine dipstick: NEGATIVE
Ketones, ur: 5 mg/dL — AB
Leukocytes,Ua: NEGATIVE
Nitrite: NEGATIVE
Protein, ur: NEGATIVE mg/dL
Specific Gravity, Urine: 1.035 — ABNORMAL HIGH (ref 1.005–1.030)
pH: 5 (ref 5.0–8.0)

## 2019-09-16 LAB — COMPREHENSIVE METABOLIC PANEL
ALT: 40 U/L (ref 0–44)
AST: 23 U/L (ref 15–41)
Albumin: 4 g/dL (ref 3.5–5.0)
Alkaline Phosphatase: 73 U/L (ref 38–126)
Anion gap: 13 (ref 5–15)
BUN: 8 mg/dL (ref 6–20)
CO2: 22 mmol/L (ref 22–32)
Calcium: 9.5 mg/dL (ref 8.9–10.3)
Chloride: 100 mmol/L (ref 98–111)
Creatinine, Ser: 0.75 mg/dL (ref 0.61–1.24)
GFR calc Af Amer: >60 mL/min (ref >60)
GFR calc non Af Amer: >60 mL/min (ref >60)
Glucose, Bld: 323 mg/dL — ABNORMAL HIGH (ref 70–99)
Potassium: 3.8 mmol/L (ref 3.5–5.1)
Sodium: 135 mmol/L (ref 135–145)
Total Bilirubin: 1.1 mg/dL (ref 0.3–1.2)
Total Protein: 8 g/dL (ref 6.5–8.1)

## 2019-09-16 LAB — POC SARS CORONAVIRUS 2 AG -  ED: SARS Coronavirus 2 Ag: NEGATIVE

## 2019-09-16 LAB — LIPASE, BLOOD: Lipase: 24 U/L (ref 11–51)

## 2019-09-16 LAB — SARS CORONAVIRUS 2 (TAT 6-24 HRS): SARS Coronavirus 2: NEGATIVE

## 2019-09-16 MED ORDER — SODIUM CHLORIDE 0.9 % IV BOLUS
1000.0000 mL | Freq: Once | INTRAVENOUS | Status: AC
  Administered 2019-09-16: 1000 mL via INTRAVENOUS

## 2019-09-16 MED ORDER — ONDANSETRON 4 MG PO TBDP: 4.0000 mg | ORAL_TABLET | Freq: Three times a day (TID) | ORAL | 0 refills | Status: AC | PRN

## 2019-09-16 MED ORDER — METFORMIN HCL 500 MG PO TABS: 500.0000 mg | ORAL_TABLET | Freq: Two times a day (BID) | ORAL | 1 refills | Status: DC

## 2019-09-16 MED ORDER — SODIUM CHLORIDE 0.9% FLUSH
3.0000 mL | Freq: Once | INTRAVENOUS | Status: AC
  Administered 2019-09-16: 3 mL via INTRAVENOUS

## 2019-09-16 MED ORDER — LOPERAMIDE HCL 2 MG PO CAPS
4.0000 mg | ORAL_CAPSULE | Freq: Once | ORAL | Status: AC
  Administered 2019-09-16: 4 mg via ORAL
  Filled 2019-09-16: qty 2

## 2019-09-16 MED ORDER — ONDANSETRON HCL 4 MG/2ML IJ SOLN
4.0000 mg | Freq: Once | INTRAMUSCULAR | Status: AC
  Administered 2019-09-16: 13:00:00 4 mg via INTRAVENOUS
  Filled 2019-09-16: qty 2

## 2019-09-16 NOTE — ED Provider Notes (Addendum)
[facility] EMERGENCY DEPARTMENT Provider Note   CSN: [id number] Arrival date & time: 09/16/19  1157     History Chief Complaint  Patient presents with  . Abdominal Pain  . Diarrhea    Patrick Montes is a 28 y.o. male.  Pt presents to the ED today with diarrhea.  The pt said he's been having sx since yesterday.  He works at [employer]	[place] and has a few co workers who have tested positive for [employer].  The pt is unclear if he has been exposed to those people or not.  The pt denies any cough or sob.  He has had fevers at home.  He has not taken anything for his sx.        Past Medical History:  Diagnosis Date  . Obesity     There are no problems to display for this patient.   History reviewed. No pertinent surgical history.     No family history on file.  Social History   Tobacco Use  . Smoking status: Current Every Day Smoker    Types: Cigars  . Smokeless tobacco: Never Used  . Tobacco comment: Black and Miles  Substance Use Topics  . Alcohol use: Yes  . Drug use: Not Currently    Types: Marijuana    Home Medications Prior to Admission medications   Medication Sig Start Date End Date Taking? Authorizing Provider  acetaminophen (TYLENOL) 500 MG tablet Take 500 mg by mouth every 8 (eight) hours as needed for headache.    [provider]  metFORMIN (GLUCOPHAGE) 500 MG tablet Take 1 tablet (500 mg total) by mouth 2 (two) times daily with a meal. 09/16/19   Jacalyn Lefevre, MD  ondansetron (ZOFRAN ODT) 4 MG disintegrating tablet Take 1 tablet (4 mg total) by mouth every 8 (eight) hours as needed. 09/16/19   Jacalyn Lefevre, MD    Allergies    Patient has no known allergies.  Review of Systems   Review of Systems  Gastrointestinal: Positive for diarrhea.  All other systems reviewed and are negative.   Physical Exam Updated Vital Signs BP 127/74   Pulse (!) 103   Temp 99.1 F (37.3 C) (Oral)   Resp 18   SpO2 97%   Physical  Exam Vitals and nursing note reviewed.  Constitutional:      Appearance: He is well-developed.  HENT:     Head: Normocephalic and atraumatic.     Mouth/Throat:     Mouth: Mucous membranes are moist.     Pharynx: Oropharynx is clear.  Eyes:     Extraocular Movements: Extraocular movements intact.     Pupils: Pupils are equal, round, and reactive to light.  Cardiovascular:     Rate and Rhythm: Regular rhythm. Tachycardia present.  Pulmonary:     Effort: Pulmonary effort is normal.     Breath sounds: Normal breath sounds.  Abdominal:     General: Abdomen is flat. Bowel sounds are normal.     Palpations: Abdomen is soft.     Tenderness: There is generalized abdominal tenderness.  Skin:    General: Skin is warm.     Capillary Refill: Capillary refill takes less than 2 seconds.  Neurological:     General: No focal deficit present.     Mental Status: He is alert and oriented to person, place, and time.  Psychiatric:        Mood and Affect: Mood normal.        Behavior: Behavior normal.  ED Results / Procedures / Treatments   Labs (all labs ordered are listed, but only abnormal results are displayed) Labs Reviewed  COMPREHENSIVE METABOLIC PANEL - Abnormal; Notable for the following components:      Result Value   Glucose, Bld 323 (*)    All other components within normal limits  SARS CORONAVIRUS 2 (TAT 6-24 HRS)  LIPASE, BLOOD  CBC  URINALYSIS, ROUTINE W REFLEX MICROSCOPIC  POC SARS CORONAVIRUS 2 AG -  ED    EKG None  Radiology No results found.  Procedures Procedures (including critical care time)  Medications Ordered in ED Medications  loperamide (IMODIUM) capsule 4 mg (has no administration in time range)  sodium chloride flush (NS) 0.9 % injection 3 mL (3 mLs Intravenous Given 09/16/19 1310)  sodium chloride 0.9 % bolus 1,000 mL (0 mLs Intravenous Stopped 09/16/19 1546)  ondansetron (ZOFRAN) injection 4 mg (4 mg Intravenous Given 09/16/19 1310)    ED  Course  I have reviewed the triage vital signs and the nursing notes.  Pertinent labs & imaging results that were available during my care of the patient were reviewed by me and considered in my medical decision making (see chart for details).    MDM Rules/Calculators/A&P     CHA2DS2/VAS Stroke Risk Points      N/A >= 2 Points: High Risk  1 - 1.99 Points: Medium Risk  0 Points: Low Risk    A final score could not be computed because of missing components.: Last  Change: N/A     This score determines the patient's risk of having a stroke if the  patient has atrial fibrillation.      This score is not applicable to this patient. Components are not  calculated.                   Pt given IVFs and is feeling better.    Initial covid ag negative.  PCR test pending.     I did re-order pt's metformin as he's been out.   Patrick Montes was evaluated in Emergency Department on 09/16/2019 for the symptoms described in the history of present illness. He was evaluated in the context of the global COVID-19 pandemic, which necessitated consideration that the patient might be at risk for infection with the SARS-CoV-2 virus that causes COVID-19. Institutional protocols and algorithms that pertain to the evaluation of patients at risk for COVID-19 are in a state of rapid change based on information released by regulatory bodies including the CDC and federal and state organizations. These policies and algorithms were followed during the patient's care in the ED.   Final Clinical Impression(s) / ED Diagnoses Final diagnoses:  Diarrhea, unspecified type  Hyperglycemia  Suspected COVID-19 virus infection    Rx / DC Orders ED Discharge Orders         Ordered    metFORMIN (GLUCOPHAGE) 500 MG tablet  2 times daily with meals     09/16/19 1547    ondansetron (ZOFRAN ODT) 4 MG disintegrating tablet  Every 8 hours PRN     09/16/19 1549           Isla Pence, MD 09/16/19 1550     Isla Pence, MD 09/16/19 [phone]

## 2019-09-16 NOTE — ED Notes (Signed)
Walk test performed with pt in room, able to perform ambulation without dizziness and with spO2 remaining 99% or greater.

## 2019-09-16 NOTE — ED Notes (Signed)
Pt aware of need for urine sample, urinal at bedside 

## 2019-09-16 NOTE — ED Triage Notes (Signed)
Pt reports gen abd pain and diarrhea that began yesterday, also endorses some chills. States 2 people from his job tested positive but he is not sure who they are or if he was ever in contact with them.

## 2019-09-16 NOTE — Discharge Instructions (Addendum)
OTC imodium as needed for diarrhea. °

## 2020-08-27 ENCOUNTER — Encounter (HOSPITAL_COMMUNITY): Payer: Self-pay | Admitting: Pediatrics

## 2020-08-27 ENCOUNTER — Other Ambulatory Visit: Payer: Self-pay

## 2020-08-27 ENCOUNTER — Emergency Department (HOSPITAL_COMMUNITY)
Admission: EM | Admit: 2020-08-27 | Discharge: 2020-08-27 | Disposition: A | Discharge status: Home / Self Care | Payer: Self-pay | Attending: Emergency Medicine | Admitting: Emergency Medicine

## 2020-08-27 DIAGNOSIS — R030 Elevated blood-pressure reading, without diagnosis of hypertension: Secondary | ICD-10-CM | POA: Insufficient documentation

## 2020-08-27 DIAGNOSIS — M25511 Pain in right shoulder: Secondary | ICD-10-CM | POA: Insufficient documentation

## 2020-08-27 DIAGNOSIS — F17290 Nicotine dependence, other tobacco product, uncomplicated: Secondary | ICD-10-CM | POA: Insufficient documentation

## 2020-08-27 NOTE — ED Notes (Signed)
XL sling applied to R arm/shoulder, pt denies questions at this time.

## 2020-08-27 NOTE — ED Triage Notes (Signed)
Patient c/o right upper arm pain for months, worst the last 2 weeks. Stated pain is worst with movement and lifting.

## 2020-08-27 NOTE — Discharge Instructions (Addendum)
At this time there does not appear to be the presence of an emergent medical condition, however there is always the potential for conditions to change. Please read and follow the below instructions.  Please return to the Emergency Department immediately for any new or worsening symptoms. Please be sure to follow up with your Primary Care Provider within one week regarding your visit today; please call their office to schedule an appointment even if you are feeling better for a follow-up visit. Please call the on-call orthopedic specialist Dr. Dion Saucier on your discharge paperwork to schedule follow-up appointment for further evaluation and treatment of your right shoulder pain.  You may use the sling given to you today to help protect your shoulder from further injury.  As we discussed you may attempt gentle range of motion exercises to avoid stiffness so long as it does not cause pain. Your blood pressure was elevated in the emergency department today.  Please have your blood pressure rechecked by your primary care doctor this week and discuss medication management with them at that time if indicated. As we discussed you did not want a shoulder x-ray today, it is likely that you will need imaging in the future if your pain does not improve.  Go to the nearest Emergency Department immediately if: You have fever or chills Get a very bad headache. Start to feel mixed up (confused). Feel weak or numb. Feel faint. Have very bad pain in your: Chest. Belly (abdomen). Throw up more than once. Have trouble breathing. Your arm, hand, or fingers: Tingle. Are numb. Are swollen. Are painful. Turn white or blue. You have any new/concerning or worsening of symptoms  Please read the additional information packets attached to your discharge summary.  Do not take your medicine if  develop an itchy rash, swelling in your mouth or lips, or difficulty breathing; call 911 and seek immediate emergency medical  attention if this occurs.  You may review your lab tests and imaging results in their entirety on your MyChart account.  Please discuss all results of fully with your primary care provider and other specialist at your follow-up visit.  Note: Portions of this text may have been transcribed using voice recognition software. Every effort was made to ensure accuracy; however, inadvertent computerized transcription errors may still be present.

## 2020-08-27 NOTE — ED Provider Notes (Addendum)
[facility] EMERGENCY DEPARTMENT Provider Note   CSN: [id number] Arrival date & time: 08/27/20  1228     History Chief Complaint  Patient presents with   Arm Pain    Patrick Montes is a 29 y.o. male history of obesity and diabetes.  Patient presents today for right shoulder pain onset 3-4 months ago, no clear inciting event.  He describes pain with lateral arm raise above shoulder level and with external rotation past 90 degrees.  He reports pain has worsened over the last 2 weeks, pain is mostly whenever he moves heavy things.  Describes pain as a mild sharp ache only with certain movements and completely alleviates with rest, nonradiating.  Denies fall/injury, fever/chills, neck pain, swelling/color change, numbness/tingling, weakness, chest pain/shortness of breath, pain with exertion headache, vision changes or any additional concerns.  HPI     Past Medical History:  Diagnosis Date   Obesity     There are no problems to display for this patient.   History reviewed. No pertinent surgical history.     No family history on file.  Social History   Tobacco Use   Smoking status: Current Every Day Smoker    Types: Cigars   Smokeless tobacco: Never Used   Tobacco comment: Black and [employer]   Vaping Use: Never used  Substance Use Topics   Alcohol use: Yes   Drug use: Not Currently    Types: Marijuana    Home Medications Prior to Admission medications   Medication Sig Start Date End Date Taking? Authorizing Provider  acetaminophen (TYLENOL) 500 MG tablet Take 500 mg by mouth every 8 (eight) hours as needed for headache.    [provider]  metFORMIN (GLUCOPHAGE) 500 MG tablet Take 1 tablet (500 mg total) by mouth 2 (two) times daily with a meal. 09/16/19   Jacalyn Lefevre, MD  ondansetron (ZOFRAN ODT) 4 MG disintegrating tablet Take 1 tablet (4 mg total) by mouth every 8 (eight) hours as needed. 09/16/19   Jacalyn Lefevre, MD    Allergies    Patient has no known allergies.  Review of Systems   Review of Systems  Constitutional: Negative.  Negative for chills and fever.  Respiratory: Negative.  Negative for shortness of breath.   Cardiovascular: Negative.  Negative for chest pain.  Musculoskeletal: Positive for arthralgias (Right shoulder). Negative for back pain, joint swelling and neck pain.  Skin: Negative.  Negative for color change.  Neurological: Negative.  Negative for weakness, numbness and headaches.    Physical Exam Updated Vital Signs BP (!) 166/111 (BP Location: Left Arm)    Pulse 93    Temp 99.3 F (37.4 C) (Oral)    Resp 16    SpO2 99%   Physical Exam Constitutional:      General: He is not in acute distress.    Appearance: Normal appearance. He is well-developed. He is obese. He is not ill-appearing or diaphoretic.  HENT:     Head: Normocephalic and atraumatic.  Eyes:     General: Vision grossly intact. Gaze aligned appropriately.     Pupils: Pupils are equal, round, and reactive to light.  Neck:     Trachea: Trachea and phonation normal.  Cardiovascular:     Rate and Rhythm: Normal rate and regular rhythm.     Pulses:          Radial pulses are 2+ on the right side and 2+ on the left side.  Pulmonary:  Effort: Pulmonary effort is normal. No respiratory distress.  Abdominal:     General: There is no distension.     Palpations: Abdomen is soft.     Tenderness: There is no abdominal tenderness. There is no guarding or rebound.  Musculoskeletal:        General: Normal range of motion.     Cervical back: Normal range of motion.     Comments: Cervical Spine: Appearance normal. No obvious bony deformity. No skin swelling, erythema, heat, fluctuance or break of the skin. No TTP over the cervical spinous processes. No paraspinal tenderness. No step-offs. Patient is able to actively rotate their neck 45 degrees left and right voluntarily without pain and flex and extend the  neck without pain.  ---- Right Shoulder: Appearance normal. No obvious bony deformity. No skin swelling, erythema, heat, fluctuance or break of the skin. No clavicular deformity or TTP. TTP over anterior deltoid. Active and passive flexion, extension, abduction, adduction, and internal/external rotation intact without crepitus.  Pain only present when patients arm actively extends above shoulder level during lateral raise and anterior shoulder raise.  Minimal pain with passive range of motion above shoulder level.  Strength for flexion, extension, abduction, adduction, and internal/external rotation intact and appropriate for age.  Right Elbow: Appearance normal. No obvious bony deformity. No skin swelling, erythema, heat, fluctuance or break of the skin. No TTP over joint. Active flexion, extension, supination and pronation full and intact without pain. Strength able and appropriate for age for flexion and extension.  Radial Pulse 2+. Cap refill <2 seconds. SILT for M/U/R distributions. Compartments soft.   Skin:    General: Skin is warm and dry.  Neurological:     Mental Status: He is alert.     GCS: GCS eye subscore is 4. GCS verbal subscore is 5. GCS motor subscore is 6.     Comments: Speech is clear and goal oriented, follows commands Major Cranial nerves without deficit, no facial droop Moves extremities without ataxia, coordination intact  Psychiatric:        Behavior: Behavior normal.     ED Results / Procedures / Treatments   Labs (all labs ordered are listed, but only abnormal results are displayed) Labs Reviewed - No data to display  EKG None  Radiology No results found.  Procedures Procedures (including critical care time)  Medications Ordered in ED Medications - No data to display  ED Course  I have reviewed the triage vital signs and the nursing notes.  Pertinent labs & imaging results that were available during my care of the patient were reviewed by me and  considered in my medical decision making (see chart for details).    MDM Rules/Calculators/A&P                         Additional history obtained from: 1. Nursing notes from this visit. ---------------------- 29 year old male presented for multiple months of intermittent right shoulder pain.  This pain is only present with certain movements at the shoulder primarily with movements above shoulder level.  He is very good range of motion only endorses mild pain he can fully mobilize the shoulder above shoulder level.  He has no bony tenderness on exam and no neck pain, neurovascularly intact.  Low suspicion for fracture/dislocation at this time.  Discussed possibility of imaging with the patient and risk versus benefits of plain film x-ray, shared decision-making was made and patient decided to defer x-ray imaging today  and instead follow-up with orthopedist for reevaluation.  I feel this is a reasonable course at this time.  Suspect patient with possible rotator cuff etiology of pain today versus ligamentous, tendon or labral injury. Splint was given and gentle range of motion exercises to avoid frozen shoulder as tolerated were discussed.  No evidence to suggest cellulitis, septic arthritis, DVT, compartment syndrome, neurovascular compromise or other emergent etiologies of right shoulder pain.  Referral given to on-call orthopedist Dr. Dion Saucier.  Incidentally patient's blood pressure was elevated in triage, asymptomatic, patient informed of this and to follow-up with his PCP for blood pressure recheck and medication management within 1 week.  Signs/symptoms of hypertensive urgency/emergency discussed and patient to return to the ER if they occur.  At this time there does not appear to be any evidence of an acute emergency medical condition and the patient appears stable for discharge with appropriate outpatient follow up. Diagnosis was discussed with patient who verbalizes understanding of care plan and  is agreeable to discharge. I have discussed return precautions with patient who verbalizes understanding. Patient encouraged to follow-up with their PCP and Ortho. All questions answered.  Patient's case discussed with Dr. Freida Busman who agrees with plan to discharge with follow-up.   Note: Portions of this report may have been transcribed using voice recognition software. Every effort was made to ensure accuracy; however, inadvertent computerized transcription errors may still be present. Final Clinical Impression(s) / ED Diagnoses Final diagnoses:  Right shoulder pain, unspecified chronicity  Elevated blood pressure reading    Rx / DC Orders ED Discharge Orders    None       Elizabeth Palau 08/27/20 1314    Elizabeth Palau 08/27/20 1332    Lorre Nick, MD 08/28/20 [phone]

## 2020-08-27 NOTE — ED Notes (Signed)
Review D/C papers with pt, pt states understanding, pt denies questions at this time. 

## 2020-09-19 ENCOUNTER — Emergency Department (HOSPITAL_COMMUNITY): Payer: Self-pay

## 2020-09-19 ENCOUNTER — Other Ambulatory Visit: Payer: Self-pay

## 2020-09-19 ENCOUNTER — Emergency Department (HOSPITAL_COMMUNITY)
Admission: EM | Admit: 2020-09-19 | Discharge: 2020-09-19 | Disposition: A | Discharge status: Home / Self Care | Payer: Self-pay | Attending: Emergency Medicine | Admitting: Emergency Medicine

## 2020-09-19 DIAGNOSIS — Z7984 Long term (current) use of oral hypoglycemic drugs: Secondary | ICD-10-CM | POA: Insufficient documentation

## 2020-09-19 DIAGNOSIS — R0789 Other chest pain: Secondary | ICD-10-CM

## 2020-09-19 DIAGNOSIS — F17290 Nicotine dependence, other tobacco product, uncomplicated: Secondary | ICD-10-CM | POA: Insufficient documentation

## 2020-09-19 DIAGNOSIS — E119 Type 2 diabetes mellitus without complications: Secondary | ICD-10-CM | POA: Insufficient documentation

## 2020-09-19 LAB — URINALYSIS, ROUTINE W REFLEX MICROSCOPIC
Bacteria, UA: NONE SEEN
Bilirubin Urine: NEGATIVE
Glucose, UA: >=500 mg/dL — AB
Hgb urine dipstick: NEGATIVE
Ketones, ur: 5 mg/dL — AB
Leukocytes,Ua: NEGATIVE
Nitrite: NEGATIVE
Protein, ur: NEGATIVE mg/dL
Specific Gravity, Urine: 1.029 (ref 1.005–1.030)
pH: 6 (ref 5.0–8.0)

## 2020-09-19 LAB — COMPREHENSIVE METABOLIC PANEL
ALT: 41 U/L (ref 0–44)
AST: 34 U/L (ref 15–41)
Albumin: 3.9 g/dL (ref 3.5–5.0)
Alkaline Phosphatase: 73 U/L (ref 38–126)
Anion gap: 14 (ref 5–15)
BUN: 12 mg/dL (ref 6–20)
CO2: 21 mmol/L — ABNORMAL LOW (ref 22–32)
Calcium: 9.2 mg/dL (ref 8.9–10.3)
Chloride: 102 mmol/L (ref 98–111)
Creatinine, Ser: 0.61 mg/dL (ref 0.61–1.24)
GFR, Estimated: >60 mL/min (ref >60)
Glucose, Bld: 262 mg/dL — ABNORMAL HIGH (ref 70–99)
Potassium: 4.3 mmol/L (ref 3.5–5.1)
Sodium: 137 mmol/L (ref 135–145)
Total Bilirubin: 0.8 mg/dL (ref 0.3–1.2)
Total Protein: 7.4 g/dL (ref 6.5–8.1)

## 2020-09-19 LAB — CBC
HCT: 45.9 % (ref 39.0–52.0)
Hemoglobin: 14.9 g/dL (ref 13.0–17.0)
MCH: 29.3 pg (ref 26.0–34.0)
MCHC: 32.5 g/dL (ref 30.0–36.0)
MCV: 90.4 fL (ref 80.0–100.0)
Platelets: 227 10*3/uL (ref 150–400)
RBC: 5.08 MIL/uL (ref 4.22–5.81)
RDW: 12.6 % (ref 11.5–15.5)
WBC: 7.4 10*3/uL (ref 4.0–10.5)
nRBC: 0 % (ref 0.0–0.2)

## 2020-09-19 LAB — LIPASE, BLOOD: Lipase: 24 U/L (ref 11–51)

## 2020-09-19 MED ORDER — METFORMIN HCL 500 MG PO TABS: 500.0000 mg | ORAL_TABLET | Freq: Two times a day (BID) | ORAL | 1 refills | Status: DC

## 2020-09-19 MED ORDER — CYCLOBENZAPRINE HCL 10 MG PO TABS: 10.0000 mg | ORAL_TABLET | Freq: Two times a day (BID) | ORAL | 0 refills | Status: AC | PRN

## 2020-09-19 NOTE — ED Triage Notes (Signed)
Pt here via pov with reports of L sided flank pain onset yesterday morning. Pt took gasx without relief. Reports worsening pain.

## 2020-09-19 NOTE — ED Provider Notes (Signed)
[facility] EMERGENCY DEPARTMENT Provider Note   CSN: [id number] Arrival date & time: 09/19/20  0941     History Chief Complaint  Patient presents with   Flank Pain    Patrick Montes is a 29 y.o. male past medical history of type 2 diabetes, presenting to the emergency department with complaint of left-sided rib pain that began yesterday morning.  He states woke up in the morning and felt some sharp pains under his left arm they radiates around his back and towards his upper abdomen.  His pain is absent at rest though is provoked with deep breathing, bending forward, sitting up, or lifting his left arm over his head.  He is not short of breath.  He has no nausea, vomiting, diarrhea, constipation, urinary symptoms, rash, fever.  He reports noncompliance with Metformin for diabetes.  He treated his symptoms with ibuprofen, as well as Gas-X as a friend told him it may be gas related.  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  Not recall a particular injury though does do lifting and some twisting motions of his torso.  The history is provided by the patient.       Past Medical History:  Diagnosis Date   Obesity     There are no problems to display for this patient.   No past surgical history on file.     No family history on file.  Social History   Tobacco Use   Smoking status: Current Every Day Smoker    Types: Cigars   Smokeless tobacco: Never Used   Tobacco comment: Black and Economist   Vaping Use: Never used  Substance Use Topics   Alcohol use: Yes   Drug use: Not Currently    Types: Marijuana    Home Medications Prior to Admission medications   Medication Sig Start Date End Date Taking? Authorizing Provider  acetaminophen (TYLENOL) 500 MG tablet Take 500 mg by mouth every 8 (eight) hours as needed for headache.    [provider]  cyclobenzaprine (FLEXERIL) 10 MG tablet Take 1 tablet (10  mg total) by mouth 2 (two) times daily as needed for muscle spasms. 09/19/20   Treydon Henricks, [place] N, PA-C  metFORMIN (GLUCOPHAGE) 500 MG tablet Take 1 tablet (500 mg total) by mouth 2 (two) times daily with a meal. 09/16/19   Jacalyn Lefevre, MD  ondansetron (ZOFRAN ODT) 4 MG disintegrating tablet Take 1 tablet (4 mg total) by mouth every 8 (eight) hours as needed. 09/16/19   Jacalyn Lefevre, MD    Allergies    Patient has no known allergies.  Review of Systems   Review of Systems  All other systems reviewed and are negative.   Physical Exam Updated Vital Signs BP 126/66    Pulse 64    Temp 98.7 F (37.1 C) (Oral)    Resp 15    Ht 6\' 3"  (1.905 m)    Wt (!) 161 kg    SpO2 100%    BMI 44.37 kg/m   Physical Exam Vitals and nursing note reviewed.  Constitutional:      General: He is not in acute distress.    Appearance: He is well-developed and well-nourished. He is obese. He is not ill-appearing.  HENT:     Head: Normocephalic and atraumatic.  Eyes:     Conjunctiva/sclera: Conjunctivae normal.  Cardiovascular:     Rate and Rhythm: Normal rate and regular rhythm.  Pulmonary:  Effort: Pulmonary effort is normal. No respiratory distress.     Breath sounds: Normal breath sounds.  Abdominal:     General: Bowel sounds are normal.     Palpations: Abdomen is soft.     Tenderness: There is no right CVA tenderness or left CVA tenderness.     Comments: Mild TTP to the inferior margin of the left anterior chest wall  Skin:    General: Skin is warm.     Findings: No rash.  Neurological:     Mental Status: He is alert.  Psychiatric:        Mood and Affect: Mood and affect normal.        Behavior: Behavior normal.     ED Results / Procedures / Treatments   Labs (all labs ordered are listed, but only abnormal results are displayed) Labs Reviewed  COMPREHENSIVE METABOLIC PANEL - Abnormal; Notable for the following components:      Result Value   CO2 21 (*)    Glucose, Bld 262 (*)     All other components within normal limits  URINALYSIS, ROUTINE W REFLEX MICROSCOPIC - Abnormal; Notable for the following components:   Glucose, UA >=500 (*)    Ketones, ur 5 (*)    All other components within normal limits  LIPASE, BLOOD  CBC    EKG None  Radiology DG Abdomen Acute W/Chest  Result Date: 09/19/2020 CLINICAL DATA:  Left-sided flank pain EXAM: DG ABDOMEN ACUTE WITH 1 VIEW CHEST COMPARISON:  None. FINDINGS: There is no evidence of dilated bowel loops or free intraperitoneal air. No radiopaque calculi or other significant radiographic abnormality is seen. Heart size and mediastinal contours are within normal limits. Both lungs are clear. IMPRESSION: Negative abdominal radiographs.  No acute cardiopulmonary disease. Electronically Signed   By: Jasmine Pang M.D.   On: 09/19/2020 17:34    Procedures Procedures (including critical care time)  Medications Ordered in ED Medications - No data to display  ED Course  I have reviewed the triage vital signs and the nursing notes.  Pertinent labs & imaging results that were available during my care of the patient were reviewed by me and considered in my medical decision making (see chart for details).    MDM Rules/Calculators/A&P                          Patient presenting with pain to his left ribs/flank that began yesterday morning.  It is sharp in nature, and produced with particular movements.  He is having no pain at rest.  No rashes.  No shortness of breath or chest pain, no abdominal symptoms or urinary symptoms.  No fevers.  On exam he is well-appearing no distress.  Has some tenderness to the inferior left anterior chest wall/inferior border of the ribs though no other abdominal tenderness, guarding or rebound.  No CVA tenderness.  Labs obtained in triage are reassuring with normal white blood cell count, normal creatinine and liver function.  UA and metabolic panel show evidence of poorly controlled diabetes though  no DKA.  He is encouraged to hydrate and repeat start his Metformin as directed.  X-ray of his chest/abdomen is negative.  Presentation seems most consistent with muscle strain, likely of the chest wall.  Will prescribe muscle relaxant.  Recommend continue ibuprofen as needed, topical ice or heat.  Return for any new or worsening symptoms. PCP follow up.  Final Clinical Impression(s) / ED Diagnoses Final diagnoses:  Chest wall pain    Rx / DC Orders ED Discharge Orders         Ordered    cyclobenzaprine (FLEXERIL) 10 MG tablet  2 times daily PRN        09/19/20 1742           Mycah Formica, [place] N, PA-C 09/19/20 1742    Tilden Fossa, MD 09/19/20 Windell Moment

## 2020-09-19 NOTE — Discharge Instructions (Addendum)
Your x-ray is normal.  Apply ice to your areas of pain for 20 minutes at a time. You can try heat if this provides more relief. You can take 600 mg of Advil/ibuprofen every 6 hours as needed for pain. You can take flexeril every 12 hours as needed for muscle spasm. Be aware this medication can make you drowsy. Drink plenty of water and restart your metformin, as your sugar is high today. Schedule an appointment with your primary care provider to follow up on your visit today. Return for shortness of breath, or other concerning symptoms.

## 2020-09-19 NOTE — ED Notes (Signed)
Pt st's pain is worse with movement.  No known injury, no hx of heavy lifting

## 2021-07-13 ENCOUNTER — Encounter (HOSPITAL_COMMUNITY): Payer: Self-pay

## 2021-07-13 ENCOUNTER — Other Ambulatory Visit: Payer: Self-pay

## 2021-07-13 ENCOUNTER — Emergency Department (HOSPITAL_COMMUNITY)
Admission: EM | Admit: 2021-07-13 | Discharge: 2021-07-13 | Disposition: A | Discharge status: Home / Self Care | Payer: Self-pay | Attending: Emergency Medicine | Admitting: Emergency Medicine

## 2021-07-13 DIAGNOSIS — E119 Type 2 diabetes mellitus without complications: Secondary | ICD-10-CM | POA: Insufficient documentation

## 2021-07-13 DIAGNOSIS — Z7984 Long term (current) use of oral hypoglycemic drugs: Secondary | ICD-10-CM | POA: Insufficient documentation

## 2021-07-13 DIAGNOSIS — N4889 Other specified disorders of penis: Secondary | ICD-10-CM

## 2021-07-13 DIAGNOSIS — R21 Rash and other nonspecific skin eruption: Secondary | ICD-10-CM | POA: Insufficient documentation

## 2021-07-13 DIAGNOSIS — N481 Balanitis: Secondary | ICD-10-CM

## 2021-07-13 HISTORY — DX: Type 2 diabetes mellitus without complications: E11.9

## 2021-07-13 LAB — RAPID HIV SCREEN (HIV 1/2 AB+AG)
HIV 1/2 Antibodies: NONREACTIVE
HIV-1 P24 Antigen - HIV24: NONREACTIVE

## 2021-07-13 MED ORDER — DOXYCYCLINE HYCLATE 100 MG PO CAPS: 100.0000 mg | ORAL_CAPSULE | Freq: Two times a day (BID) | ORAL | 0 refills | Status: DC

## 2021-07-13 MED ORDER — CEFTRIAXONE SODIUM 1 G IJ SOLR
500.0000 mg | Freq: Once | INTRAMUSCULAR | Status: AC
  Administered 2021-07-13: 500 mg via INTRAMUSCULAR
  Filled 2021-07-13: qty 10

## 2021-07-13 MED ORDER — LIDOCAINE HCL 1 % IJ SOLN
INTRAMUSCULAR | Status: AC
  Filled 2021-07-13: qty 20

## 2021-07-13 MED ORDER — CLOTRIMAZOLE 1 % EX CREA: TOPICAL_CREAM | CUTANEOUS | 0 refills | Status: AC

## 2021-07-13 MED ORDER — VALACYCLOVIR HCL 1 G PO TABS: 1000.0000 mg | ORAL_TABLET | Freq: Three times a day (TID) | ORAL | 0 refills | Status: AC

## 2021-07-13 NOTE — ED Provider Notes (Signed)
[facility]-EMERGENCY DEPT Provider Note   CSN: [id number] Arrival date & time: 07/13/21  6761     History Chief Complaint  Patient presents with   Penis Pain    Patrick Montes is a 30 y.o. male.  31 year old male presents with several days of rash to the tip of his penis.  Patient denies any dysuria or hematuria.  No penile drainage itself.  Patient is uncircumcised and recently got a new male partner.  History of chlamydia before in the past.  No treatment use prior to arrival      Past Medical History:  Diagnosis Date   Diabetes mellitus without complication (HCC)    Obesity     There are no problems to display for this patient.   History reviewed. No pertinent surgical history.     History reviewed. No pertinent family history.  Social History   Tobacco Use   Smoking status: Never   Smokeless tobacco: Never   Tobacco comments:    Black and Economist   Vaping Use: Never used  Substance Use Topics   Alcohol use: Yes   Drug use: Not Currently    Types: Marijuana    Home Medications Prior to Admission medications   Medication Sig Start Date End Date Taking? Authorizing Provider  acetaminophen (TYLENOL) 500 MG tablet Take 500 mg by mouth every 8 (eight) hours as needed for headache.    [provider]  cyclobenzaprine (FLEXERIL) 10 MG tablet Take 1 tablet (10 mg total) by mouth 2 (two) times daily as needed for muscle spasms. 09/19/20   Robinson, [place] N, PA-C  metFORMIN (GLUCOPHAGE) 500 MG tablet Take 1 tablet (500 mg total) by mouth 2 (two) times daily with a meal. 09/19/20   Roxan Hockey, [place] N, PA-C  ondansetron (ZOFRAN ODT) 4 MG disintegrating tablet Take 1 tablet (4 mg total) by mouth every 8 (eight) hours as needed. 09/16/19   Jacalyn Lefevre, MD    Allergies    Patient has no known allergies.  Review of Systems   Review of Systems  All other systems reviewed and are negative.  Physical Exam Updated Vital  Signs BP (!) 156/100 (BP Location: Left Arm)   Pulse 97   Temp 98.1 F (36.7 C) (Oral)   Resp 17   Ht 1.905 m (6\' 3" )   Wt (!) 156 kg   SpO2 95%   BMI 43.00 kg/m   Physical Exam Vitals and nursing note reviewed.  Constitutional:      General: He is not in acute distress.    Appearance: Normal appearance. He is well-developed. He is not toxic-appearing.  HENT:     Head: Normocephalic and atraumatic.  Eyes:     General: Lids are normal.     Conjunctiva/sclera: Conjunctivae normal.     Pupils: Pupils are equal, round, and reactive to light.  Neck:     Thyroid: No thyroid mass.     Trachea: No tracheal deviation.  Cardiovascular:     Rate and Rhythm: Normal rate and regular rhythm.     Heart sounds: Normal heart sounds. No murmur heard.   No gallop.  Pulmonary:     Effort: Pulmonary effort is normal. No respiratory distress.     Breath sounds: Normal breath sounds. No stridor. No decreased breath sounds, wheezing, rhonchi or rales.  Abdominal:     General: There is no distension.     Palpations: Abdomen is soft.     Tenderness: There is  no abdominal tenderness. There is no rebound.  Genitourinary:    Penis: Uncircumcised.      Comments: Cheeselike discharge noted across the glans of patient's penis.  Multiple areas of small ulcerations noted concerning for possible herpetic infection. Musculoskeletal:        General: No tenderness. Normal range of motion.     Cervical back: Normal range of motion and neck supple.  Skin:    General: Skin is warm and dry.     Findings: No abrasion or rash.  Neurological:     Mental Status: He is alert and oriented to person, place, and time. Mental status is at baseline.     GCS: GCS eye subscore is 4. GCS verbal subscore is 5. GCS motor subscore is 6.     Cranial Nerves: Cranial nerves are intact. No cranial nerve deficit.     Sensory: No sensory deficit.     Motor: Motor function is intact.  Psychiatric:        Attention and  Perception: Attention normal.        Speech: Speech normal.        Behavior: Behavior normal.    ED Results / Procedures / Treatments   Labs (all labs ordered are listed, but only abnormal results are displayed) Labs Reviewed  RAPID HIV SCREEN (HIV 1/2 AB+AG)  RPR  GC/CHLAMYDIA PROBE AMP ([place]) NOT AT [facility]    EKG None  Radiology No results found.  Procedures Procedures   Medications Ordered in ED Medications  cefTRIAXone (ROCEPHIN) injection 500 mg (has no administration in time range)    ED Course  I have reviewed the triage vital signs and the nursing notes.  Pertinent labs & imaging results that were available during my care of the patient were reviewed by me and considered in my medical decision making (see chart for details).    MDM Rules/Calculators/A&P                           Patient with likely balanitis and/or possible herpes.  Patient treated with Rocephin here.  RPR and HIV testing done.  Will place on doxycycline as well as acyclovir. Final Clinical Impression(s) / ED Diagnoses Final diagnoses:  None    Rx / DC Orders ED Discharge Orders     None        Lorre Nick, MD 07/13/21 1200

## 2021-07-13 NOTE — ED Triage Notes (Signed)
Patient states he "has a blister on his penis" x 2-3 days ago. Patient states if he pulls the foreskin back, he has increased pain. Patient denies dysuria or penile discharge.

## 2021-07-14 LAB — GC/CHLAMYDIA PROBE AMP (~~LOC~~) NOT AT ARMC
Chlamydia: NEGATIVE
Comment: NEGATIVE
Comment: NORMAL
Neisseria Gonorrhea: NEGATIVE

## 2021-07-14 LAB — RPR: RPR Ser Ql: NONREACTIVE

## 2022-06-08 ENCOUNTER — Other Ambulatory Visit: Payer: Self-pay

## 2022-06-08 ENCOUNTER — Encounter (HOSPITAL_COMMUNITY): Payer: Self-pay | Admitting: Emergency Medicine

## 2022-06-08 ENCOUNTER — Emergency Department (HOSPITAL_COMMUNITY)
Admission: EM | Admit: 2022-06-08 | Discharge: 2022-06-08 | Disposition: A | Discharge status: Home / Self Care | Payer: Self-pay | Attending: Emergency Medicine | Admitting: Emergency Medicine

## 2022-06-08 DIAGNOSIS — I10 Essential (primary) hypertension: Secondary | ICD-10-CM | POA: Insufficient documentation

## 2022-06-08 DIAGNOSIS — R739 Hyperglycemia, unspecified: Secondary | ICD-10-CM

## 2022-06-08 DIAGNOSIS — Z7984 Long term (current) use of oral hypoglycemic drugs: Secondary | ICD-10-CM | POA: Insufficient documentation

## 2022-06-08 DIAGNOSIS — E1165 Type 2 diabetes mellitus with hyperglycemia: Secondary | ICD-10-CM | POA: Insufficient documentation

## 2022-06-08 DIAGNOSIS — R000 Tachycardia, unspecified: Secondary | ICD-10-CM | POA: Insufficient documentation

## 2022-06-08 DIAGNOSIS — L02219 Cutaneous abscess of trunk, unspecified: Secondary | ICD-10-CM | POA: Insufficient documentation

## 2022-06-08 LAB — CBG MONITORING, ED
Glucose-Capillary: 418 mg/dL — ABNORMAL HIGH (ref 70–99)
Glucose-Capillary: 373 mg/dL — ABNORMAL HIGH (ref 70–99)

## 2022-06-08 LAB — CBC WITH DIFFERENTIAL/PLATELET
Abs Immature Granulocytes: 0.03 10*3/uL (ref 0.00–0.07)
Basophils Absolute: 0.1 10*3/uL (ref 0.0–0.1)
Basophils Relative: 1 %
Eosinophils Absolute: 0 10*3/uL (ref 0.0–0.5)
Eosinophils Relative: 0 %
HCT: 45.7 % (ref 39.0–52.0)
Hemoglobin: 15.5 g/dL (ref 13.0–17.0)
Immature Granulocytes: 0 %
Lymphocytes Relative: 23 %
Lymphs Abs: 2.1 10*3/uL (ref 0.7–4.0)
MCH: 30.1 pg (ref 26.0–34.0)
MCHC: 33.9 g/dL (ref 30.0–36.0)
MCV: 88.7 fL (ref 80.0–100.0)
Monocytes Absolute: 1.1 10*3/uL — ABNORMAL HIGH (ref 0.1–1.0)
Monocytes Relative: 12 %
Neutro Abs: 5.8 10*3/uL (ref 1.7–7.7)
Neutrophils Relative %: 64 %
Platelets: 268 10*3/uL (ref 150–400)
RBC: 5.15 MIL/uL (ref 4.22–5.81)
RDW: 12.6 % (ref 11.5–15.5)
WBC: 9.1 10*3/uL (ref 4.0–10.5)
nRBC: 0 % (ref 0.0–0.2)

## 2022-06-08 LAB — COMPREHENSIVE METABOLIC PANEL
ALT: 46 U/L — ABNORMAL HIGH (ref 0–44)
AST: 35 U/L (ref 15–41)
Albumin: 3.9 g/dL (ref 3.5–5.0)
Alkaline Phosphatase: 103 U/L (ref 38–126)
Anion gap: 16 — ABNORMAL HIGH (ref 5–15)
BUN: 9 mg/dL (ref 6–20)
CO2: 20 mmol/L — ABNORMAL LOW (ref 22–32)
Calcium: 9.6 mg/dL (ref 8.9–10.3)
Chloride: 98 mmol/L (ref 98–111)
Creatinine, Ser: 0.85 mg/dL (ref 0.61–1.24)
GFR, Estimated: >60 mL/min (ref >60)
Glucose, Bld: 449 mg/dL — ABNORMAL HIGH (ref 70–99)
Potassium: 4 mmol/L (ref 3.5–5.1)
Sodium: 134 mmol/L — ABNORMAL LOW (ref 135–145)
Total Bilirubin: 0.8 mg/dL (ref 0.3–1.2)
Total Protein: 8.6 g/dL — ABNORMAL HIGH (ref 6.5–8.1)

## 2022-06-08 MED ORDER — BACITRACIN ZINC 500 UNIT/GM EX OINT: TOPICAL_OINTMENT | Freq: Once | CUTANEOUS | Status: AC

## 2022-06-08 MED ORDER — SODIUM CHLORIDE 0.9 % IV SOLN
100.0000 mg | Freq: Once | INTRAVENOUS | Status: AC
  Administered 2022-06-08: 100 mg via INTRAVENOUS
  Filled 2022-06-08: qty 100

## 2022-06-08 MED ORDER — METFORMIN HCL 500 MG PO TABS: 500.0000 mg | ORAL_TABLET | Freq: Two times a day (BID) | ORAL | 1 refills | Status: AC

## 2022-06-08 MED ORDER — DOXYCYCLINE HYCLATE 100 MG PO CAPS: 100.0000 mg | ORAL_CAPSULE | Freq: Two times a day (BID) | ORAL | 0 refills | Status: AC

## 2022-06-08 MED ORDER — LIDOCAINE HCL (PF) 1 % IJ SOLN
30.0000 mL | Freq: Once | INTRAMUSCULAR | Status: AC
  Administered 2022-06-08: 30 mL
  Filled 2022-06-08: qty 30

## 2022-06-08 MED ORDER — SODIUM CHLORIDE 0.9 % IV BOLUS
1000.0000 mL | Freq: Once | INTRAVENOUS | Status: AC
  Administered 2022-06-08: 1000 mL via INTRAVENOUS

## 2022-06-08 MED ORDER — BACITRACIN ZINC 500 UNIT/GM EX OINT
TOPICAL_OINTMENT | CUTANEOUS | Status: AC
  Administered 2022-06-08: 1 via TOPICAL
  Filled 2022-06-08: qty 0.9

## 2022-06-08 NOTE — ED Notes (Signed)
4x4 bacitracin and abd pad applied to drained abscess on abd.

## 2022-06-08 NOTE — ED Triage Notes (Signed)
Patient reports skin abscess at lower abdomen onset last week , denies discharge or fever .

## 2022-06-08 NOTE — ED Provider Notes (Signed)
[place] EMERGENCY DEPARTMENT Provider Note   CSN: [id number] Arrival date & time: 06/08/22  [id number]     History  Chief Complaint  Patient presents with   Abscess    Patrick Montes is a 31 y.o. male.  With history of diabetes and previous abscesses presents to the ED for evaluation of an abscess.  Patient states he noticed the abscess 4 days ago, but stated increasing pain yesterday.  He tried warm compress last night, but said it made the pain worse.  Abscess is midline lower abdomen just superior to the pubic area and in a fat fold.  Denies drainage.  Reports a dull ache that is constant, states pain worse with abdominal contractions, moving, direct pressure.  Denies fevers, chills, nausea, vomiting, dysuria, diarrhea.  Patient states he has previously had abscesses on his right leg, and right buttock that required drainage.  Patient also reports he has not taken his metformin for the past month because he ran out and has not followed up with his primary care provider.  HPI     Home Medications Prior to Admission medications   Medication Sig Start Date End Date Taking? Authorizing Provider  acetaminophen (TYLENOL) 500 MG tablet Take 500 mg by mouth every 8 (eight) hours as needed for headache.    [provider]  clotrimazole (LOTRIMIN) 1 % cream Apply to affected area 2 times daily 07/13/21   Lacretia Leigh, MD  cyclobenzaprine (FLEXERIL) 10 MG tablet Take 1 tablet (10 mg total) by mouth 2 (two) times daily as needed for muscle spasms. 09/19/20   Robinson, [place] N, PA-C  doxycycline (VIBRAMYCIN) 100 MG capsule Take 1 capsule (100 mg total) by mouth 2 (two) times daily. 07/13/21   Lacretia Leigh, MD  metFORMIN (GLUCOPHAGE) 500 MG tablet Take 1 tablet (500 mg total) by mouth 2 (two) times daily with a meal. 09/19/20   Quentin Cornwall, [place] N, PA-C  ondansetron (ZOFRAN ODT) 4 MG disintegrating tablet Take 1 tablet (4 mg total) by mouth every 8 (eight) hours as  needed. 09/16/19   Isla Pence, MD  valACYclovir (VALTREX) 1000 MG tablet Take 1 tablet (1,000 mg total) by mouth 3 (three) times daily. 07/13/21   Lacretia Leigh, MD      Allergies    Patient has no known allergies.    Review of Systems   Review of Systems  Constitutional:  Negative for chills, fatigue and fever.  Respiratory:  Negative for shortness of breath.   Cardiovascular:  Negative for chest pain.  Gastrointestinal:  Negative for abdominal pain, diarrhea, nausea and vomiting.  Genitourinary:  Negative for dysuria, frequency, penile pain, testicular pain and urgency.  All other systems reviewed and are negative.   Physical Exam Updated Vital Signs BP (!) 157/113 (BP Location: Right Arm)   Pulse (!) 121   Temp 99.9 F (37.7 C) (Oral)   Resp 20   SpO2 96%  Physical Exam Vitals and nursing note reviewed.  Constitutional:      General: He is not in acute distress.    Appearance: Normal appearance. He is obese. He is not ill-appearing.  HENT:     Head: Normocephalic and atraumatic.     Mouth/Throat:     Mouth: Mucous membranes are moist.     Pharynx: Oropharynx is clear. No oropharyngeal exudate or posterior oropharyngeal erythema.  Cardiovascular:     Rate and Rhythm: Regular rhythm. Tachycardia present.     Pulses: Normal pulses.     Heart sounds:  Normal heart sounds. No murmur heard. Pulmonary:     Effort: Pulmonary effort is normal. No respiratory distress.     Breath sounds: No wheezing.  Abdominal:     General: Abdomen is flat.     Tenderness: There is no abdominal tenderness.  Musculoskeletal:        General: Normal range of motion.     Cervical back: Neck supple.  Skin:    General: Skin is warm and dry.          Comments: Linear fluctuant mass measuring approximately 4 cm x 1/2 cm on his fat fold on the midline of the abdomen just superior to the pubic area.  Minimal surrounding erythema.  Neurological:     Mental Status: He is alert and oriented to  person, place, and time.  Psychiatric:        Mood and Affect: Mood normal.        Behavior: Behavior normal.     ED Results / Procedures / Treatments   Labs (all labs ordered are listed, but only abnormal results are displayed) Labs Reviewed  CBC WITH DIFFERENTIAL/PLATELET  CBG MONITORING, ED    EKG None  Radiology No results found.  Procedures .Marland KitchenIncision and Drainage  Date/Time: 06/08/2022 11:07 AM  Performed by: Michelle Piper, PA-C Authorized by: Michelle Piper, PA-C   Consent:    Consent obtained:  Verbal   Consent given by:  Patient   Risks discussed:  Bleeding, incomplete drainage, pain and damage to other organs   Alternatives discussed:  No treatment Universal protocol:    Procedure explained and questions answered to patient or proxy's satisfaction: yes     Relevant documents present and verified: yes     Test results available : yes     Imaging studies available: yes     Required blood products, implants, devices, and special equipment available: yes     Site/side marked: yes     Immediately prior to procedure, a time out was called: yes     Patient identity confirmed:  Verbally with patient Location:    Type:  Abscess   Size:  4 cm x 0.5 cm   Location:  Trunk   Trunk location:  Abdomen (Midline abdomen just superior to the pubic region) Pre-procedure details:    Skin preparation:  Betadine and chlorhexidine Sedation:    Sedation type:  None Anesthesia:    Anesthesia method:  Local infiltration   Local anesthetic:  Lidocaine 1% WITH epi and lidocaine 1% w/o epi Procedure type:    Complexity:  Simple Procedure details:    Incision types:  Single straight   Incision depth:  Subcutaneous   Wound management:  Probed and deloculated, irrigated with saline and extensive cleaning   Drainage:  Purulent   Drainage amount:  Copious   Wound treatment:  Wound left open   Packing materials:  None Post-procedure details:    Procedure completion:   Tolerated well, no immediate complications     Medications Ordered in ED Medications  lidocaine (PF) (XYLOCAINE) 1 % injection 30 mL (has no administration in time range)    ED Course/ Medical Decision Making/ A&P                           Medical Decision Making Amount and/or Complexity of Data Reviewed Labs: ordered.  Risk OTC drugs. Prescription drug management.  This patient presents to the ED for concern of abscess and  hyperglycemia, this involves an extensive number of treatment options, and is a complaint that carries with it a high risk of complications and morbidity.  The differential diagnosis includes hyperglycemia, skin abscess, DKA, HHS, cellulitis   Co morbidities that complicate the patient evaluation  Uncontrolled, unmonitored diabetes, history of abscesses  My initial workup includes CBC, CBG, CMP, incision and drainage of abscess  Additional history obtained from: Nursing notes from this visit. Previous records within EMR system on 07/13/2021 for balanitis and 09/24/2013 for an abscess  I ordered, reviewed and interpreted labs which include: CBC, CMP, CBG.  CBG showed blood sugar of 418.  All other labs within normal limit, patient does not have a leukocytosis.   Afebrile, hypertensive but otherwise hemodynamically stable.  Asymptomatic.  Patient presented for evaluation of an abscess in the fat fold of his pannus that he noticed approximately 4 days ago.  States pain got worse with abdominal contractions and movement yesterday.  This was successfully incised and drained in the ED.  Due to location, topical antibiotic was placed in the area and a dressing was applied.  Patient also has a history of diabetes that is unmanaged and uncontrolled.  He had a blood sugar of 418 today, this was treated with 1 L normal saline and reduced to 373 prior to discharge.  I will send patient home with prescription for doxycycline, I will also send in a refill of his metformin  since he states he has not taken it in the past 2 months.  I informed patient that it is imperative that he gets his blood sugar under control and follows up with his primary care provider as soon as possible.  Stable at the time of discharge.   At this time there does not appear to be any evidence of an acute emergency medical condition and the patient appears stable for discharge with appropriate outpatient follow up. Diagnosis was discussed with patient who verbalizes understanding of care plan and is agreeable to discharge. I have discussed return precautions with patient who verbalizes understanding. Patient encouraged to follow-up with their PCP within 1 week. All questions answered.  Patient's case discussed with Dr. Jeraldine Loots who agrees with plan to discharge with follow-up.   Note: Portions of this report may have been transcribed using voice recognition software. Every effort was made to ensure accuracy; however, inadvertent computerized transcription errors may still be present.          Final Clinical Impression(s) / ED Diagnoses Final diagnoses:  None    Rx / DC Orders ED Discharge Orders     None         Michelle Piper, Cordelia Poche 06/08/22 1433    Gerhard Munch, MD 06/08/22 [phone]

## 2022-06-08 NOTE — ED Notes (Signed)
PATIENT REFUSED VITALS ,CAUSE  HE HAD TO WAIT,

## 2022-06-08 NOTE — Discharge Instructions (Addendum)
You have been seen today for your complaint of abscess on your trunk. Your lab work showed a high blood sugar of 420.  This was treated with 1 L of normal saline in the IV.  You should follow-up with your primary care provider soon as possible to get blood sugar under control. Your discharge medications includedoxycycline. This is an antibiotic. You should take this as prescribed. You should take it for the entire duration of the prescription. This may cause an upset stomach. That is normal. You may take it with food. I will also send a refill for your metformin. Home care instructions are as follows:  You should keep the area clean and dry, you may clean the area with warm soapy water multiple times a day.  Follow up with: Your primary care provider in 1 week Please seek immediate medical care if you develop any of the following symptoms: Have severe pain. See red streaks on your skin spreading away from the abscess. See redness that spreads quickly. Have a fever or chills. At this time there does not appear to be the presence of an emergent medical condition, however there is always the potential for conditions to change. Please read and follow the below instructions.  Do not take your medicine if  develop an itchy rash, swelling in your mouth or lips, or difficulty breathing; call 911 and seek immediate emergency medical attention if this occurs.  You may review your lab tests and imaging results in their entirety on your MyChart account.  Please discuss all results of fully with your primary care provider and other specialist at your follow-up visit.  Note: Portions of this text may have been transcribed using voice recognition software. Every effort was made to ensure accuracy; however, inadvertent computerized transcription errors may still be present.
